# Patient Record
Sex: Male | Born: 1950
Health system: Southern US, Community
[De-identification: ages and names within clinical notes are randomized; demographics above are authoritative.]

## PROBLEM LIST (undated history)

## (undated) DIAGNOSIS — R011 Cardiac murmur, unspecified: Secondary | ICD-10-CM

## (undated) DIAGNOSIS — G709 Myoneural disorder, unspecified: Secondary | ICD-10-CM

## (undated) DIAGNOSIS — R519 Headache, unspecified: Secondary | ICD-10-CM

## (undated) DIAGNOSIS — C801 Malignant (primary) neoplasm, unspecified: Secondary | ICD-10-CM

## (undated) DIAGNOSIS — K219 Gastro-esophageal reflux disease without esophagitis: Secondary | ICD-10-CM

## (undated) DIAGNOSIS — R768 Other specified abnormal immunological findings in serum: Secondary | ICD-10-CM

## (undated) DIAGNOSIS — I10 Essential (primary) hypertension: Secondary | ICD-10-CM

## (undated) DIAGNOSIS — K759 Inflammatory liver disease, unspecified: Secondary | ICD-10-CM

## (undated) DIAGNOSIS — R51 Headache: Secondary | ICD-10-CM

## (undated) DIAGNOSIS — M1990 Unspecified osteoarthritis, unspecified site: Secondary | ICD-10-CM

## (undated) DIAGNOSIS — R7689 Other specified abnormal immunological findings in serum: Secondary | ICD-10-CM

## (undated) DIAGNOSIS — G629 Polyneuropathy, unspecified: Secondary | ICD-10-CM

## (undated) HISTORY — PX: OTHER SURGICAL HISTORY: SHX169

## (undated) HISTORY — DX: Other specified abnormal immunological findings in serum: R76.8

## (undated) HISTORY — DX: Unspecified osteoarthritis, unspecified site: M19.90

## (undated) HISTORY — DX: Essential (primary) hypertension: I10

## (undated) HISTORY — DX: Polyneuropathy, unspecified: G62.9

## (undated) HISTORY — PX: CARPAL TUNNEL RELEASE: SHX101

## (undated) HISTORY — DX: Other specified abnormal immunological findings in serum: R76.89

---

## 2005-07-14 ENCOUNTER — Ambulatory Visit: Payer: Self-pay | Admitting: Internal Medicine

## 2009-11-25 ENCOUNTER — Ambulatory Visit: Payer: Self-pay | Admitting: Internal Medicine

## 2010-01-20 ENCOUNTER — Ambulatory Visit
Admission: RE | Admit: 2010-01-20 | Discharge: 2010-01-20 | Payer: Self-pay | Source: Home / Self Care | Attending: Internal Medicine | Admitting: Internal Medicine

## 2012-05-01 ENCOUNTER — Encounter: Payer: Self-pay | Admitting: Internal Medicine

## 2012-05-01 DIAGNOSIS — D72829 Elevated white blood cell count, unspecified: Secondary | ICD-10-CM

## 2012-05-01 DIAGNOSIS — G589 Mononeuropathy, unspecified: Secondary | ICD-10-CM

## 2012-06-05 ENCOUNTER — Encounter: Payer: Self-pay | Admitting: Internal Medicine

## 2012-06-05 DIAGNOSIS — D72829 Elevated white blood cell count, unspecified: Secondary | ICD-10-CM

## 2012-06-05 DIAGNOSIS — C649 Malignant neoplasm of unspecified kidney, except renal pelvis: Secondary | ICD-10-CM

## 2012-06-05 DIAGNOSIS — B1920 Unspecified viral hepatitis C without hepatic coma: Secondary | ICD-10-CM

## 2012-07-17 ENCOUNTER — Encounter (INDEPENDENT_AMBULATORY_CARE_PROVIDER_SITE_OTHER): Payer: Self-pay | Admitting: Internal Medicine

## 2012-07-17 ENCOUNTER — Ambulatory Visit (INDEPENDENT_AMBULATORY_CARE_PROVIDER_SITE_OTHER): Payer: Medicare Other | Admitting: Internal Medicine

## 2012-07-17 VITALS — BP 132/60 | HR 70 | Temp 98.3°F | Ht 76.0 in | Wt 224.8 lb

## 2012-07-17 DIAGNOSIS — I10 Essential (primary) hypertension: Secondary | ICD-10-CM | POA: Insufficient documentation

## 2012-07-17 DIAGNOSIS — M1990 Unspecified osteoarthritis, unspecified site: Secondary | ICD-10-CM | POA: Insufficient documentation

## 2012-07-17 DIAGNOSIS — K7460 Unspecified cirrhosis of liver: Secondary | ICD-10-CM

## 2012-07-17 DIAGNOSIS — M129 Arthropathy, unspecified: Secondary | ICD-10-CM

## 2012-07-17 DIAGNOSIS — B1920 Unspecified viral hepatitis C without hepatic coma: Secondary | ICD-10-CM

## 2012-07-17 NOTE — Patient Instructions (Addendum)
OV in 1 month. Will review treatment options.

## 2012-07-17 NOTE — Progress Notes (Signed)
Subjective:     Patient ID: Jason Robinson, male   DOB: Jun 27, 1950, 62 y.o.   MRN: [id number]  HPI Here today for f/u of his Hepatitis C. Diagnosed in 1999 with Hepatitis C. Hx of failed prior antiviral therapy. He was treated about 12 yrs ago with short acting interferon and ribavirin, but he did not achieve viral clearance EOT. He was a partial responder. Treated by Dr. Karilyn Cota.  Liver biopsy over 12 yrs ago.  Risk factors for Hepatitis C: He cannot tell me. No IV drug use. No tattoos. No blood transfusions.  He tells me he underwent an [place] for Hepatitis C and was found to have a renal carcinoma. Post partial nephrectomy in 2011 by Dr. Odella Aquas. Sees Dr. Toney Reil for leukocytosis as well as lymphocytosis and well as monocytosis. He tells me he had a bone biopsy which was normal.  Genotype 1B 2012 Hep C Quantitative [phone]. 12/11/97 Liver Biopsy Hepatic tissue showing chronic active hepatitis and cirrhosis, consistent with Hepatitis C infection.   Review of Systems see hpi Past Medical History  Diagnosis Date  . Hepatitis C reactive   . Arthritis   . Neuropathy   . Hypertension    Past Surgical History  Procedure Laterality Date  . Renal carcinoa: rt kidney     Allergies  Allergen Reactions  . Penicillins          Objective:   Physical Exam  Filed Vitals:   07/17/12 1554  BP: 132/60  Pulse: 70  Temp: 98.3 F (36.8 C)  Height: 6\' 4"  (1.93 m)  Weight: 224 lb 12.8 oz (101.969 kg)  Alert and oriented. Skin warm and dry. Oral mucosa is moist.   . Sclera anicteric, conjunctivae is pink. Thyroid not enlarged. No cervical lymphadenopathy. Lungs clear. Heart regular rate and rhythm.  Abdomen is soft. Bowel sounds are positive. No hepatomegaly. No abdominal masses felt. No tenderness.  No edema to lower extremities.        Assessment:     Hepatitis C.active. He had had treatment failure in the past.  I discussed this case with Dr. Karilyn Cota.     Plan:    AFP, PT/INR,  CMET, Hep C quaint.  CBC. OV in 2 months. US abdomen (Will do US abdomen instead of a liver biopsy per Dr. Karilyn Cota) Patient will be receiving Hepatitis C treatment.

## 2012-07-18 LAB — COMPREHENSIVE METABOLIC PANEL
ALT: 67 U/L — ABNORMAL HIGH (ref 0–53)
AST: 53 U/L — ABNORMAL HIGH (ref 0–37)
Alkaline Phosphatase: 83 U/L (ref 39–117)
CO2: 21 mEq/L (ref 19–32)
Sodium: 138 mEq/L (ref 135–145)
Total Bilirubin: 0.6 mg/dL (ref 0.3–1.2)
Total Protein: 7.4 g/dL (ref 6.0–8.3)

## 2012-07-18 LAB — CBC WITH DIFFERENTIAL/PLATELET
Eosinophils Absolute: 0.4 10*3/uL (ref 0.0–0.7)
Eosinophils Relative: 3 % (ref 0–5)
HCT: 44.1 % (ref 39.0–52.0)
Hemoglobin: 15.5 g/dL (ref 13.0–17.0)
Lymphocytes Relative: 29 % (ref 12–46)
Lymphs Abs: 4.3 10*3/uL — ABNORMAL HIGH (ref 0.7–4.0)
MCH: 31.1 pg (ref 26.0–34.0)
MCV: 88.4 fL (ref 78.0–100.0)
Monocytes Absolute: 0.9 10*3/uL (ref 0.1–1.0)
Monocytes Relative: 6 % (ref 3–12)
RBC: 4.99 MIL/uL (ref 4.22–5.81)
WBC: 14.9 10*3/uL — ABNORMAL HIGH (ref 4.0–10.5)

## 2012-07-18 LAB — AFP TUMOR MARKER: AFP-Tumor Marker: 8.3 ng/mL — ABNORMAL HIGH (ref 0.0–8.0)

## 2012-07-19 LAB — HEPATITIS C RNA QUANTITATIVE
HCV Quantitative Log: 6.08 {Log} — ABNORMAL HIGH (ref <1.18)
HCV Quantitative: 1213599 IU/mL — ABNORMAL HIGH (ref <15)

## 2012-07-24 ENCOUNTER — Ambulatory Visit (HOSPITAL_COMMUNITY)
Admission: RE | Admit: 2012-07-24 | Discharge: 2012-07-24 | Disposition: A | Discharge status: Home / Self Care | Payer: Medicare Other | Source: Ambulatory Visit | Attending: Internal Medicine | Admitting: Internal Medicine

## 2012-07-24 DIAGNOSIS — B1920 Unspecified viral hepatitis C without hepatic coma: Secondary | ICD-10-CM | POA: Insufficient documentation

## 2012-07-24 DIAGNOSIS — K8020 Calculus of gallbladder without cholecystitis without obstruction: Secondary | ICD-10-CM | POA: Insufficient documentation

## 2012-08-21 ENCOUNTER — Ambulatory Visit (INDEPENDENT_AMBULATORY_CARE_PROVIDER_SITE_OTHER): Payer: Medicare Other | Admitting: Internal Medicine

## 2012-08-21 ENCOUNTER — Encounter (INDEPENDENT_AMBULATORY_CARE_PROVIDER_SITE_OTHER): Payer: Self-pay | Admitting: Internal Medicine

## 2012-08-21 VITALS — BP 128/60 | HR 60 | Temp 98.0°F | Ht 76.0 in | Wt 225.8 lb

## 2012-08-21 DIAGNOSIS — B182 Chronic viral hepatitis C: Secondary | ICD-10-CM

## 2012-08-21 DIAGNOSIS — I10 Essential (primary) hypertension: Secondary | ICD-10-CM

## 2012-08-21 DIAGNOSIS — R5381 Other malaise: Secondary | ICD-10-CM

## 2012-08-21 LAB — CBC WITH DIFFERENTIAL/PLATELET
Basophils Relative: 1 % (ref 0–1)
Eosinophils Absolute: 0.8 10*3/uL — ABNORMAL HIGH (ref 0.0–0.7)
MCH: 31.3 pg (ref 26.0–34.0)
MCHC: 34.6 g/dL (ref 30.0–36.0)
Monocytes Relative: 7 % (ref 3–12)
Neutrophils Relative %: 52 % (ref 43–77)
Platelets: 240 10*3/uL (ref 150–400)

## 2012-08-21 LAB — COMPREHENSIVE METABOLIC PANEL
ALT: 71 U/L — ABNORMAL HIGH (ref 0–53)
Albumin: 4.2 g/dL (ref 3.5–5.2)
BUN: 12 mg/dL (ref 6–23)
CO2: 23 mEq/L (ref 19–32)
Calcium: 9.6 mg/dL (ref 8.4–10.5)
Creat: 0.88 mg/dL (ref 0.50–1.35)
Total Bilirubin: 0.3 mg/dL (ref 0.3–1.2)
Total Protein: 7.2 g/dL (ref 6.0–8.3)

## 2012-08-21 NOTE — Progress Notes (Signed)
Subjective:     Patient ID: Jason Robinson, male   DOB: [birth year], 62 y.o.   MRN: [id number]  HPI Here today for f/u of his Hepatitis C. Diagnosed in 1999. He has a hx of treatment failure he says in 2000 (partial responder).  He was treated with interferon and ribavirin.  Treated by Dr. Karilyn Cota. He is Genotype 1B. There were no risk factors for Hepatitis C. No blood transfusion.  No tattoos. Only risk factor is goig fishing with a friend who had Hepatitis C and was yellow. No IV drugs. Hx of renal carcinoma. Post partial rt nephrectomy in 2012 by Dr. Odella Aquas.  12/11/97: Liver biopsy: Hepatic tissue showing chronic active hepatitis and cirrhosis, consistert with Hepatitis C infection.   Appetite is good. No weight loss. No abdominal distention. No melena or bright red rectal bleeding.  BMs are normal.        07/2012  Range 39mo ago    HCV Quantitative <15 IU/mL [id number] (H)     HCV Quantitative Log <1.18 log 10 6.08 (H)        CBC    Component Value Date/Time   WBC 14.9* 07/17/2012 1650   RBC 4.99 07/17/2012 1650   HGB 15.5 07/17/2012 1650   HCT 44.1 07/17/2012 1650   PLT 250 07/17/2012 1650   MCV 88.4 07/17/2012 1650   MCH 31.1 07/17/2012 1650   MCHC 35.1 07/17/2012 1650   RDW 14.0 07/17/2012 1650   LYMPHSABS 4.3* 07/17/2012 1650   MONOABS 0.9 07/17/2012 1650   EOSABS 0.4 07/17/2012 1650   BASOSABS 0.1 07/17/2012 1650   CMP     Component Value Date/Time   NA 138 07/17/2012 1650   K 4.5 07/17/2012 1650   CL 107 07/17/2012 1650   CO2 21 07/17/2012 1650   GLUCOSE 90 07/17/2012 1650   BUN 11 07/17/2012 1650   CREATININE 0.91 07/17/2012 1650   CALCIUM 9.6 07/17/2012 1650   PROT 7.4 07/17/2012 1650   ALBUMIN 4.4 07/17/2012 1650   AST 53* 07/17/2012 1650   ALT 67* 07/17/2012 1650   ALKPHOS 83 07/17/2012 1650   BILITOT 0.6 07/17/2012 1650    07/17/2012 : AFP 8.3 07/17/2012: PT/INR 13.1 ane 1.01   07/27/2012 US abdomen:  1. Multiple gallstones are present. No other evidence for acute   cholecystitis.  2. No hydronephrosis.  3. Limited visualization of the pancreas.    Hx of leukocytosis and has seen Dr. Asencion Noble in the past for this. He says he does not see him anymore. Leukemia was ruled out.   He is disabled. 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  He is widowed. He has 1 daughter that is adopted. and 2 granddaughters.  12/03/2008 Colonoscopy: Three small polyps ablated via cold biopsy, two from hepatic flexure.  A few small diverticula at sigmoid colon.  External and internal hemorrhoids.    Review of Systems Current Outpatient Prescriptions  Medication Sig Dispense Refill  . amLODipine-benazepril (LOTREL) 10-40 MG per capsule Take 1 capsule by mouth daily.      . Ascorbic Acid (VITAMIN C) 100 MG tablet Take 100 mg by mouth daily.      Marland Kitchen aspirin 325 MG EC tablet Take 325 mg by mouth 2 (two) times daily.      . cetirizine (ZYRTEC) 10 MG tablet Take 10 mg by mouth daily.      Marland Kitchen eletriptan (RELPAX) 40 MG tablet Take 40 mg by mouth as needed  for migraine. One tablet by mouth at onset of headache. May repeat in 2 hours if headache persists or recurs.      . Ginkgo Biloba 120 MG CAPS Take by mouth.      Marland Kitchen KRILL OIL PO Take by mouth.      . milk thistle 175 MG tablet Take 175 mg by mouth daily.      . Multiple Vitamin (MULTIVITAMIN) tablet Take 1 tablet by mouth daily.      . psyllium (METAMUCIL) 58.6 % packet Take 1 packet by mouth daily.       No current facility-administered medications for this visit.   Past Medical History  Diagnosis Date  . Hepatitis C reactive   . Arthritis   . Neuropathy     ? etiology  . Hypertension    Past Surgical History  Procedure Laterality Date  . Renal carcinoa: rt kidney     Allergies  Allergen Reactions  . Penicillins         Objective:  Filed Vitals:   08/21/12 1154  BP: 128/60  Pulse: 60  Temp: 98 F (36.7 C)  Height: 6\' 4"  (1.93 m)  Weight: 225 lb 12.8 oz (102.422 kg)      Physical  Exam Alert and oriented. Skin warm and dry. Oral mucosa is moist.   . Sclera anicteric, conjunctivae is pink. Thyroid not enlarged. No cervical lymphadenopathy. Lungs clear. Heart regular rate and rhythm.  Abdomen is soft. Bowel sounds are positive. No hepatomegaly. No abdominal masses felt. No tenderness.  No edema to lower extremities.   Bruisng noted to rt forearm.     Assessment:    Active Hepatitis C. He has been a partial responder in the past.   I discussed this case in July with Dr. Karilyn Cota.  Plan:    CBC, CMET, TSH. We will treat for Hepatitis C once these labs are back.

## 2012-08-21 NOTE — Patient Instructions (Addendum)
CBC, CMET, TSH. OV pending. We are going to treat.

## 2012-08-22 LAB — TSH: TSH: 1.593 u[IU]/mL (ref 0.350–4.500)

## 2012-08-28 ENCOUNTER — Telehealth (INDEPENDENT_AMBULATORY_CARE_PROVIDER_SITE_OTHER): Payer: Self-pay | Admitting: Internal Medicine

## 2012-08-28 NOTE — Telephone Encounter (Signed)
Wrong number.

## 2012-11-07 ENCOUNTER — Telehealth (INDEPENDENT_AMBULATORY_CARE_PROVIDER_SITE_OTHER): Payer: Self-pay | Admitting: Internal Medicine

## 2012-11-07 ENCOUNTER — Encounter (INDEPENDENT_AMBULATORY_CARE_PROVIDER_SITE_OTHER): Payer: Self-pay | Admitting: Internal Medicine

## 2012-11-07 NOTE — Telephone Encounter (Signed)
Jason Robinson, needs an OV in next 2 weeks with Tammy for patient teaching.

## 2012-11-09 ENCOUNTER — Other Ambulatory Visit: Payer: Self-pay

## 2012-11-09 NOTE — Telephone Encounter (Signed)
Apt has been scheduled for 11/15/12 with Tammy for teaching.

## 2012-11-15 ENCOUNTER — Ambulatory Visit (INDEPENDENT_AMBULATORY_CARE_PROVIDER_SITE_OTHER): Payer: Medicare Other

## 2012-11-15 ENCOUNTER — Telehealth (INDEPENDENT_AMBULATORY_CARE_PROVIDER_SITE_OTHER): Payer: Self-pay | Admitting: *Deleted

## 2012-11-15 NOTE — Telephone Encounter (Signed)
Jason Robinson presented to the office this morning for Hepatitis C Teaching. His daughter, Jason Robinson is with him as his support person. We reviewed the three (3) medications that he will be using and what they are for. He has been prescribed the following treatment plan by Dorene Ar ,NP: Pegasys 180 mcg [place] once weekly, Sovaldi 400 mg Take one 400 mg tablet PO QD with or without food and Ribavirin 200 mg Tablet  Take 3 Tablets in the morning and 3 Tablets in the evening. We reviewed the possible side effects and things that could be done to help with them.  Jason Robinson states that he will start his treatment on Monday , November 17 th. He will call and let me know and his first lab is due to be drawn 2 weeks post the beginning of his treatment (December 1 , 2014). Terri presented to the room to ask the patient if he had any other questions, He ask about if he should take his vitamin C and milk thistle ,ect. He was advised by Delrae Rend that he should. Both the patient and his daughter were given contact number to reach our office for any further concerns and or questions.

## 2012-11-15 NOTE — Telephone Encounter (Signed)
reviewed

## 2012-11-20 ENCOUNTER — Telehealth (INDEPENDENT_AMBULATORY_CARE_PROVIDER_SITE_OTHER): Payer: Self-pay | Admitting: *Deleted

## 2012-11-20 DIAGNOSIS — B1920 Unspecified viral hepatitis C without hepatic coma: Secondary | ICD-10-CM

## 2012-11-20 NOTE — Telephone Encounter (Signed)
FYI - Started his treatment today, 11/20/12.

## 2012-11-20 NOTE — Telephone Encounter (Signed)
Patient has started his Hepatitis C treatment as of today, 11/20/12. Patient will need to have CBC/Diff in 2 weeks. Noted for December 1,2014.

## 2012-11-22 ENCOUNTER — Other Ambulatory Visit (INDEPENDENT_AMBULATORY_CARE_PROVIDER_SITE_OTHER): Payer: Self-pay | Admitting: *Deleted

## 2012-11-22 ENCOUNTER — Encounter (INDEPENDENT_AMBULATORY_CARE_PROVIDER_SITE_OTHER): Payer: Self-pay | Admitting: *Deleted

## 2012-11-22 DIAGNOSIS — B1920 Unspecified viral hepatitis C without hepatic coma: Secondary | ICD-10-CM

## 2012-12-04 LAB — CBC WITH DIFFERENTIAL/PLATELET
Basophils Absolute: 0 10*3/uL (ref 0.0–0.1)
Basophils Relative: 0 % (ref 0–1)
Eosinophils Relative: 4 % (ref 0–5)
HCT: 40.5 % (ref 39.0–52.0)
Lymphocytes Relative: 43 % (ref 12–46)
Lymphs Abs: 5 10*3/uL — ABNORMAL HIGH (ref 0.7–4.0)
MCHC: 34.8 g/dL (ref 30.0–36.0)
Monocytes Absolute: 0.7 10*3/uL (ref 0.1–1.0)
Neutro Abs: 5.5 10*3/uL (ref 1.7–7.7)
Neutrophils Relative %: 47 % (ref 43–77)
Platelets: 180 10*3/uL (ref 150–400)
RBC: 4.57 MIL/uL (ref 4.22–5.81)
RDW: 14.3 % (ref 11.5–15.5)
WBC: 11.6 10*3/uL — ABNORMAL HIGH (ref 4.0–10.5)

## 2012-12-08 ENCOUNTER — Telehealth (INDEPENDENT_AMBULATORY_CARE_PROVIDER_SITE_OTHER): Payer: Self-pay | Admitting: *Deleted

## 2012-12-08 NOTE — Telephone Encounter (Signed)
CBC results noted. Mild leukocytosis otherwise normal.

## 2012-12-08 NOTE — Telephone Encounter (Signed)
Dr.Rehman the patient started Hep C treatment on 11/20/12. His 2 weeks lab (12-04-12) are in Epic for review.

## 2013-02-01 ENCOUNTER — Encounter (INDEPENDENT_AMBULATORY_CARE_PROVIDER_SITE_OTHER): Payer: Self-pay | Admitting: Internal Medicine

## 2013-02-01 ENCOUNTER — Ambulatory Visit (INDEPENDENT_AMBULATORY_CARE_PROVIDER_SITE_OTHER): Payer: Medicare Other | Admitting: Internal Medicine

## 2013-02-01 VITALS — BP 118/60 | HR 72 | Temp 98.8°F | Ht 76.0 in | Wt 228.8 lb

## 2013-02-01 DIAGNOSIS — R635 Abnormal weight gain: Secondary | ICD-10-CM

## 2013-02-01 DIAGNOSIS — K7460 Unspecified cirrhosis of liver: Secondary | ICD-10-CM

## 2013-02-01 DIAGNOSIS — R768 Other specified abnormal immunological findings in serum: Secondary | ICD-10-CM

## 2013-02-01 DIAGNOSIS — K738 Other chronic hepatitis, not elsewhere classified: Secondary | ICD-10-CM

## 2013-02-01 DIAGNOSIS — K732 Chronic active hepatitis, not elsewhere classified: Secondary | ICD-10-CM

## 2013-02-01 DIAGNOSIS — R894 Abnormal immunological findings in specimens from other organs, systems and tissues: Secondary | ICD-10-CM

## 2013-02-01 NOTE — Patient Instructions (Addendum)
OV in 4 weeks with a CBC, CMET, and Hep C quaint.

## 2013-02-01 NOTE — Progress Notes (Signed)
Subjective:     Patient ID: Jason Robinson, male   DOB: [birth year], 63 y.o.   MRN: [id number]  HPI Here today for f/u of his Hepatitis C. He is Genotype 1B. Started treatment on 11/20/2012. Presently treated with Solvaldi 400mg , Ribavirin 1280mcg, and Pegasys x  131mcg. Treatment x 12 weeks.   Diagnosed in 1999. He has a hx of treatment failure he says in 2000 (partial responder). He was treated with interferon and ribavirin. Treated by Dr. Laural Golden.  He is Genotype 1B.  This is week 10 for him Will finish February 9. There were no risk factors for Hepatitis C. No blood transfusion. No tattoos. Only risk factor is going fishing with a friend who had Hepatitis C and was yellow. No IV drugs.  Hx of renal carcinoma. Post partial rt nephrectomy in 2012 by Dr. Clent Jacks.  12/11/97: Liver biopsy: Hepatic tissue showing chronic active hepatitis and cirrhosis, consistert with Hepatitis C infection.  Appetite is good. No weight loss. No abdominal distention. No melena or bright red rectal bleeding.  BMs are normal.   07/2012  Range  73mo ago    HCV Quantitative  <15 IU/mL  [id number] (H)      HCV Quantitative Log  <1.18 log 10  6.08 (H)         He tells me he feels fine except when he takes the Pegasys. There has been no weight loss. He has actually gained 3 pounds since his last visit in August. No abdominal pain. No fever. No rashes.  Red rash to left arm and itches for a day and then goes.  Takes Pegasys on Monday nights. Urine is yellow.    CBC    Component Value Date/Time   WBC 11.6* 12/04/2012 1109   RBC 4.57 12/04/2012 1109   HGB 14.1 12/04/2012 1109   HCT 40.5 12/04/2012 1109   PLT 180 12/04/2012 1109   MCV 88.6 12/04/2012 1109   MCH 30.9 12/04/2012 1109   MCHC 34.8 12/04/2012 1109   RDW 14.3 12/04/2012 1109   LYMPHSABS 5.0* 12/04/2012 1109   MONOABS 0.7 12/04/2012 1109   EOSABS 0.4 12/04/2012 1109   BASOSABS 0.0 12/04/2012 1109  Neutrophil ABS 5.5   Review of Systems  See hpi       Objective:   Physical Exam . Filed Vitals:   02/01/13 1547  BP: 118/60  Pulse: 72  Temp: 98.8 F (37.1 C)  Height: 6\' 4"  (1.93 m)  Weight: 228 lb 12.8 oz (103.783 kg)   Alert and oriented. Skin warm and dry. Oral mucosa is moist.   . Sclera anicteric, conjunctivae is pink. Thyroid not enlarged. No cervical lymphadenopathy. Lungs clear. Heart regular rate and rhythm.  Abdomen is soft. Bowel sounds are positive. No hepatomegaly. No abdominal masses felt. No tenderness.  No edema to lower extremities. Patient is alert and oriented.     Assessment:     Hepatitis C. Will get labs today to see if he has cleared the virus.  He has been non-compliant with his office visits. Will finish tx on 02/12/2013.    Plan:     CBC, CMET, Hep C Quaint.  And TSH. OV in 4 weeks.

## 2013-02-02 LAB — CBC WITH DIFFERENTIAL/PLATELET
BASOS PCT: 0 % (ref 0–1)
Basophils Absolute: 0 10*3/uL (ref 0.0–0.1)
EOS ABS: 0.1 10*3/uL (ref 0.0–0.7)
Eosinophils Relative: 2 % (ref 0–5)
HEMATOCRIT: 37.5 % — AB (ref 39.0–52.0)
HEMOGLOBIN: 12 g/dL — AB (ref 13.0–17.0)
LYMPHS ABS: 1.9 10*3/uL (ref 0.7–4.0)
Lymphocytes Relative: 40 % (ref 12–46)
MCH: 31.7 pg (ref 26.0–34.0)
MCHC: 32 g/dL (ref 30.0–36.0)
MCV: 99.2 fL (ref 78.0–100.0)
MONOS PCT: 6 % (ref 3–12)
Monocytes Absolute: 0.3 10*3/uL (ref 0.1–1.0)
NEUTROS PCT: 52 % (ref 43–77)
Neutro Abs: 2.6 10*3/uL (ref 1.7–7.7)
Platelets: 118 10*3/uL — ABNORMAL LOW (ref 150–400)
RBC: 3.78 MIL/uL — AB (ref 4.22–5.81)
RDW: 14.5 % (ref 11.5–15.5)
WBC: 4.9 10*3/uL (ref 4.0–10.5)

## 2013-02-02 LAB — COMPREHENSIVE METABOLIC PANEL
ALT: 15 U/L (ref 0–53)
AST: 24 U/L (ref 0–37)
Albumin: 3.7 g/dL (ref 3.5–5.2)
Alkaline Phosphatase: 92 U/L (ref 39–117)
BILIRUBIN TOTAL: 1 mg/dL (ref 0.2–1.2)
BUN: 8 mg/dL (ref 6–23)
CO2: 21 meq/L (ref 19–32)
Calcium: 8.4 mg/dL (ref 8.4–10.5)
Chloride: 109 mEq/L (ref 96–112)
Creat: 0.85 mg/dL (ref 0.50–1.35)
GLUCOSE: 86 mg/dL (ref 70–99)
Potassium: 4.4 mEq/L (ref 3.5–5.3)
SODIUM: 139 meq/L (ref 135–145)
Total Protein: 6.7 g/dL (ref 6.0–8.3)

## 2013-02-02 LAB — TSH: TSH: 0.945 u[IU]/mL (ref 0.350–4.500)

## 2013-02-05 LAB — HEPATITIS C RNA QUANTITATIVE: HCV QUANT: NOT DETECTED [IU]/mL (ref <15)

## 2013-02-26 ENCOUNTER — Ambulatory Visit (INDEPENDENT_AMBULATORY_CARE_PROVIDER_SITE_OTHER): Payer: Medicare Other | Admitting: Internal Medicine

## 2013-03-12 ENCOUNTER — Encounter (INDEPENDENT_AMBULATORY_CARE_PROVIDER_SITE_OTHER): Payer: Self-pay | Admitting: Internal Medicine

## 2013-03-12 ENCOUNTER — Ambulatory Visit (INDEPENDENT_AMBULATORY_CARE_PROVIDER_SITE_OTHER): Payer: Medicare Other | Admitting: Internal Medicine

## 2013-03-12 VITALS — BP 118/72 | HR 84 | Temp 97.7°F | Ht 76.0 in | Wt 228.3 lb

## 2013-03-12 DIAGNOSIS — B1920 Unspecified viral hepatitis C without hepatic coma: Secondary | ICD-10-CM

## 2013-03-12 NOTE — Patient Instructions (Signed)
OV in 6 months. CBC, CMET, Hep C quaint today.

## 2013-03-12 NOTE — Progress Notes (Signed)
Subjective:     Patient ID: Jason Robinson, male   DOB: 10/13/1950, 63 y.o.   MRN: [id number]  HPI  Here today for f/u of his Hepatitis C. Started 11/20/2012. Treated with solvaldi 400mg , Ribavirin 1248mcg, and Pegasys 115mcg. Treated x 12 weeks. Finished treatment one month ago.(finished February 9).   02/01/2013 HCV Quaint undetected.  Genotype 1 B.  Hx of renal carcinoma. Post partial rt nephrectomy in 2012 by Dr. Clent Jacks.  12/11/97: Liver biopsy: Hepatic tissue showing chronic active hepatitis and cirrhosis, consistert with Hepatitis C infection.  Appetite is good. No weight loss. No abdominal distention. No melena or bright red rectal bleeding.  BMs are normal.   His appetite is good. No weight loss. He has maintained his weight. There is no rashes.  He did have a rash to both elbow area (anterior). Describes as a fine rash. Appear after the injection and would disappear before the next injection of Inteferon. BMs are moving okay.  CBC    Component Value Date/Time   WBC 4.9 02/01/2013 1619   RBC 3.78* 02/01/2013 1619   HGB 12.0* 02/01/2013 1619   HCT 37.5* 02/01/2013 1619   PLT 118* 02/01/2013 1619   MCV 99.2 02/01/2013 1619   MCH 31.7 02/01/2013 1619   MCHC 32.0 02/01/2013 1619   RDW 14.5 02/01/2013 1619   LYMPHSABS 1.9 02/01/2013 1619   MONOABS 0.3 02/01/2013 1619   EOSABS 0.1 02/01/2013 1619   BASOSABS 0.0 02/01/2013 1619   CMP     Component Value Date/Time   NA 139 02/01/2013 1619   K 4.4 02/01/2013 1619   CL 109 02/01/2013 1619   CO2 21 02/01/2013 1619   GLUCOSE 86 02/01/2013 1619   BUN 8 02/01/2013 1619   CREATININE 0.85 02/01/2013 1619   CALCIUM 8.4 02/01/2013 1619   PROT 6.7 02/01/2013 1619   ALBUMIN 3.7 02/01/2013 1619   AST 24 02/01/2013 1619   ALT 15 02/01/2013 1619   ALKPHOS 92 02/01/2013 1619   BILITOT 1.0 02/01/2013 1619             Review of Systems Past Medical History  Diagnosis Date  . Hepatitis C reactive   . Arthritis   . Neuropathy     ? etiology  .  Hypertension     Past Surgical History  Procedure Laterality Date  . Renal carcinoa: rt kidney      Allergies  Allergen Reactions  . Penicillins     Current Outpatient Prescriptions on File Prior to Visit  Medication Sig Dispense Refill  . amLODipine-benazepril (LOTREL) 10-40 MG per capsule Take 1 capsule by mouth daily.      . Ascorbic Acid (VITAMIN C) 100 MG tablet Take 100 mg by mouth daily.      Marland Kitchen aspirin 325 MG EC tablet Take 325 mg by mouth 2 (two) times daily.      . cetirizine (ZYRTEC) 10 MG tablet Take 10 mg by mouth daily.      Marland Kitchen eletriptan (RELPAX) 40 MG tablet Take 40 mg by mouth as needed for migraine. One tablet by mouth at onset of headache. May repeat in 2 hours if headache persists or recurs.      . Ginkgo Biloba 120 MG CAPS Take by mouth.      Marland Kitchen KRILL OIL PO Take by mouth.      . milk thistle 175 MG tablet Take 175 mg by mouth daily.      . Multiple Vitamin (MULTIVITAMIN) tablet Take  1 tablet by mouth daily.      . psyllium (METAMUCIL) 58.6 % packet Take 1 packet by mouth daily.       No current facility-administered medications on file prior to visit.        Objective:   Physical Exam There were no vitals filed for this visit. Filed Vitals:   03/12/13 1539  BP: 118/72  Pulse: 84  Temp: 97.7 F (36.5 C)  Height: 6\' 4"  (1.93 m)  Weight: 228 lb 4.8 oz (103.556 kg)  Alert and oriented. Skin warm and dry. Oral mucosa is moist.   . Sclera anicteric, conjunctivae is pink. Thyroid not enlarged. No cervical lymphadenopathy. Lungs clear. Heart regular rate and rhythm.  Abdomen is soft. Bowel sounds are positive. No hepatomegaly. No abdominal masses felt. No tenderness.  No edema to lower extremities.        Assessment:     Hepatitis C. He has cleared the virus. He feels good. No rash. Appetite has remained good.     Plan:    CBC, CMET, Hep C Quaint. OV in 6 months. Will get a US abdomen and a quaint at his next office visit.

## 2013-03-13 LAB — CBC WITH DIFFERENTIAL/PLATELET
BASOS ABS: 0 10*3/uL (ref 0.0–0.1)
Basophils Relative: 0 % (ref 0–1)
Eosinophils Absolute: 0.2 10*3/uL (ref 0.0–0.7)
Eosinophils Relative: 2 % (ref 0–5)
HCT: 43.8 % (ref 39.0–52.0)
Hemoglobin: 14.6 g/dL (ref 13.0–17.0)
LYMPHS ABS: 2 10*3/uL (ref 0.7–4.0)
Lymphocytes Relative: 27 % (ref 12–46)
MCH: 31.5 pg (ref 26.0–34.0)
MCHC: 33.3 g/dL (ref 30.0–36.0)
MCV: 94.4 fL (ref 78.0–100.0)
Monocytes Absolute: 0.5 10*3/uL (ref 0.1–1.0)
Monocytes Relative: 7 % (ref 3–12)
NEUTROS ABS: 4.8 10*3/uL (ref 1.7–7.7)
Neutrophils Relative %: 64 % (ref 43–77)
Platelets: 115 10*3/uL — ABNORMAL LOW (ref 150–400)
RBC: 4.64 MIL/uL (ref 4.22–5.81)
RDW: 13.3 % (ref 11.5–15.5)
WBC: 7.5 10*3/uL (ref 4.0–10.5)

## 2013-03-13 LAB — COMPREHENSIVE METABOLIC PANEL
ALT: 18 U/L (ref 0–53)
AST: 25 U/L (ref 0–37)
Albumin: 4.1 g/dL (ref 3.5–5.2)
Alkaline Phosphatase: 92 U/L (ref 39–117)
BUN: 12 mg/dL (ref 6–23)
CALCIUM: 9 mg/dL (ref 8.4–10.5)
CO2: 19 mEq/L (ref 19–32)
Chloride: 107 mEq/L (ref 96–112)
Creat: 0.89 mg/dL (ref 0.50–1.35)
Glucose, Bld: 90 mg/dL (ref 70–99)
POTASSIUM: 4.2 meq/L (ref 3.5–5.3)
SODIUM: 139 meq/L (ref 135–145)
TOTAL PROTEIN: 6.9 g/dL (ref 6.0–8.3)
Total Bilirubin: 0.4 mg/dL (ref 0.2–1.2)

## 2013-03-14 LAB — HEPATITIS C RNA QUANTITATIVE: HCV Quantitative: NOT DETECTED IU/mL (ref <15)

## 2013-09-17 ENCOUNTER — Ambulatory Visit (INDEPENDENT_AMBULATORY_CARE_PROVIDER_SITE_OTHER): Payer: Medicare Other | Admitting: Internal Medicine

## 2013-09-17 ENCOUNTER — Encounter (INDEPENDENT_AMBULATORY_CARE_PROVIDER_SITE_OTHER): Payer: Self-pay | Admitting: *Deleted

## 2013-09-17 ENCOUNTER — Encounter (INDEPENDENT_AMBULATORY_CARE_PROVIDER_SITE_OTHER): Payer: Self-pay | Admitting: Internal Medicine

## 2013-09-17 VITALS — BP 130/70 | HR 74 | Temp 97.2°F | Resp 18 | Ht 76.0 in | Wt 226.1 lb

## 2013-09-17 DIAGNOSIS — K7460 Unspecified cirrhosis of liver: Secondary | ICD-10-CM

## 2013-09-17 DIAGNOSIS — B182 Chronic viral hepatitis C: Secondary | ICD-10-CM

## 2013-09-17 DIAGNOSIS — Z85528 Personal history of other malignant neoplasm of kidney: Secondary | ICD-10-CM | POA: Insufficient documentation

## 2013-09-17 NOTE — Patient Instructions (Signed)
Physician will call with results of blood tests and ultrasound. We will request colonoscopy records from [facility] [facility] before next exam scheduled

## 2013-09-17 NOTE — Progress Notes (Signed)
Presenting complaint;  Followup for cirrhosis secondary to hepatitis C status post treatment.  Database;  Patient has biopsy proven cirrhosis secondary to hepatitis C genotype 1B who failed therapy about 10 years ago. He was treated with 12 weeks of Sovaldi, Pegasys and co-Pegus. He finished therapy on 02/12/2013 and HCVRNA at end of treatment was negative. He did not experience much in the they have side effects of therapy. He now returns for followup visit.  Subjective:  Patient has no complaints. He states his appetite is very good. Since he's been eating better his bowels are moving regularly and he is not constipated anymore. He also feels he has more energy than he had before. He denies abdominal pain nausea vomiting melena or rectal bleeding. He also wants to know timing of his next colonoscopy. He has history of colonic adenomas. He is still smoking cigarettes.   Current Medications: Outpatient Encounter Prescriptions as of 09/17/2013  Medication Sig  . amLODipine-benazepril (LOTREL) 10-40 MG per capsule Take 1 capsule by mouth daily.  . Ascorbic Acid (VITAMIN C) 100 MG tablet Take 500 mg by mouth daily.   Marland Kitchen aspirin 325 MG EC tablet Take 325 mg by mouth daily.   . cetirizine (ZYRTEC) 10 MG tablet Take 10 mg by mouth daily.  Marland Kitchen eletriptan (RELPAX) 40 MG tablet Take 40 mg by mouth as needed for migraine. One tablet by mouth at onset of headache. May repeat in 2 hours if headache persists or recurs.  . fluticasone (FLONASE) 50 MCG/ACT nasal spray Place 2 sprays into both nostrils daily.   . Ginkgo Biloba 120 MG CAPS Take by mouth.  Marland Kitchen KRILL OIL PO Take by mouth.  . milk thistle 175 MG tablet Take 175 mg by mouth daily.  . Multiple Vitamin (MULTIVITAMIN) tablet Take 1 tablet by mouth daily.  . [DISCONTINUED] psyllium (METAMUCIL) 58.6 % packet Take 1 packet by mouth daily.     Objective: Blood pressure 130/70, pulse 74, temperature 97.2 F (36.2 C), temperature source Oral, resp.  rate 18, height 6\' 4"  (1.93 m), weight 226 lb 1.6 oz (102.558 kg). Patient is alert and in no acute distress. Conjunctiva is pink. Sclera is nonicteric Oropharyngeal mucosa is normal. No neck masses or thyromegaly noted. Cardiac exam with regular rhythm normal S1 and S2. No murmur or gallop noted. Lungs are clear to auscultation. Abdomen is full but soft and nontender without organomegaly or masses. Shifting dullness is absent.  No LE edema or clubbing noted.  Labs/studies Results: Lab data from 03/12/2013   WBC 7.5, H&H 14.6 and 43.8 and platelet count 115 K. Electrolytes within normal limits. Glucose 90 BUN 12 and creatinine 0.89 Bilirubin 0.4, AP 92, AST 25, ALT 18, albumin 4.1 and calcium 9 point HCVRNA by PCR was undetectable..    Assessment:  #1. Cirrhosis secondary to chronic hepatitis C genotype 1b status post 12 weeks of therapy with Sovaldi, Pegasys and copegus.Marland Kitchen HCVRNA was negative at EOT. This test needs to be repeated to assess for SVR. He has mild thrombocytopenia secondary to cirrhosis and splenomegaly. He also needs to undergo screening for [facility].  #2. History of colonic adenomas. Last colonoscopy was in November 2010 when he had three tubular adenomas.  Plan:  Patient will go the lab for CBC, comprehensive chemistry panel, HCVRNA by PCR quantitative and AFP. Upper abdominal ultrasound. Surveillance colonoscopy later this fall. Office visit in one year.

## 2013-09-18 LAB — COMPREHENSIVE METABOLIC PANEL
ALT: 15 U/L (ref 0–53)
AST: 22 U/L (ref 0–37)
Albumin: 4.4 g/dL (ref 3.5–5.2)
Alkaline Phosphatase: 93 U/L (ref 39–117)
BILIRUBIN TOTAL: 0.5 mg/dL (ref 0.2–1.2)
BUN: 10 mg/dL (ref 6–23)
CO2: 27 meq/L (ref 19–32)
CREATININE: 1.04 mg/dL (ref 0.50–1.35)
Calcium: 9.9 mg/dL (ref 8.4–10.5)
Chloride: 105 mEq/L (ref 96–112)
Glucose, Bld: 58 mg/dL — ABNORMAL LOW (ref 70–99)
Potassium: 4.4 mEq/L (ref 3.5–5.3)
Sodium: 141 mEq/L (ref 135–145)
Total Protein: 7.4 g/dL (ref 6.0–8.3)

## 2013-09-18 LAB — CBC
HCT: 45.7 % (ref 39.0–52.0)
Hemoglobin: 15.8 g/dL (ref 13.0–17.0)
MCH: 31.2 pg (ref 26.0–34.0)
MCHC: 34.6 g/dL (ref 30.0–36.0)
MCV: 90.3 fL (ref 78.0–100.0)
PLATELETS: 248 10*3/uL (ref 150–400)
RBC: 5.06 MIL/uL (ref 4.22–5.81)
RDW: 13.4 % (ref 11.5–15.5)
WBC: 15.8 10*3/uL — ABNORMAL HIGH (ref 4.0–10.5)

## 2013-09-18 LAB — AFP TUMOR MARKER: AFP-Tumor Marker: 3.2 ng/mL (ref <6.1)

## 2013-09-25 ENCOUNTER — Telehealth (INDEPENDENT_AMBULATORY_CARE_PROVIDER_SITE_OTHER): Payer: Self-pay | Admitting: *Deleted

## 2013-09-25 DIAGNOSIS — B182 Chronic viral hepatitis C: Secondary | ICD-10-CM

## 2013-09-25 NOTE — Telephone Encounter (Signed)
Per Dr.Rehman the patient will need to have labs drawn. 

## 2013-10-08 ENCOUNTER — Ambulatory Visit (HOSPITAL_COMMUNITY)
Admission: RE | Admit: 2013-10-08 | Discharge: 2013-10-08 | Disposition: A | Discharge status: Home / Self Care | Payer: Medicare Other | Source: Ambulatory Visit | Attending: Diagnostic Radiology | Admitting: Diagnostic Radiology

## 2013-10-08 DIAGNOSIS — K8020 Calculus of gallbladder without cholecystitis without obstruction: Secondary | ICD-10-CM | POA: Diagnosis not present

## 2013-10-08 DIAGNOSIS — B182 Chronic viral hepatitis C: Secondary | ICD-10-CM | POA: Insufficient documentation

## 2013-10-08 DIAGNOSIS — K7460 Unspecified cirrhosis of liver: Secondary | ICD-10-CM

## 2013-10-08 DIAGNOSIS — Z905 Acquired absence of kidney: Secondary | ICD-10-CM | POA: Insufficient documentation

## 2013-12-10 ENCOUNTER — Encounter (INDEPENDENT_AMBULATORY_CARE_PROVIDER_SITE_OTHER): Payer: Self-pay | Admitting: *Deleted

## 2013-12-14 ENCOUNTER — Encounter (INDEPENDENT_AMBULATORY_CARE_PROVIDER_SITE_OTHER): Payer: Self-pay | Admitting: Internal Medicine

## 2014-01-01 ENCOUNTER — Encounter (INDEPENDENT_AMBULATORY_CARE_PROVIDER_SITE_OTHER): Payer: Self-pay | Admitting: Internal Medicine

## 2014-01-10 ENCOUNTER — Other Ambulatory Visit (INDEPENDENT_AMBULATORY_CARE_PROVIDER_SITE_OTHER): Payer: Self-pay | Admitting: *Deleted

## 2014-01-10 ENCOUNTER — Telehealth (INDEPENDENT_AMBULATORY_CARE_PROVIDER_SITE_OTHER): Payer: Self-pay | Admitting: *Deleted

## 2014-01-10 DIAGNOSIS — Z86010 Personal history of colonic polyps: Secondary | ICD-10-CM

## 2014-01-10 DIAGNOSIS — Z1211 Encounter for screening for malignant neoplasm of colon: Secondary | ICD-10-CM

## 2014-01-10 NOTE — Telephone Encounter (Signed)
Patient left a message on my voicemail. He states that he is ready to arrange his Colonoscopy. States that he was to have already had one but due to his sister's death didn't. He may be reached at [phone].

## 2014-01-10 NOTE — Telephone Encounter (Signed)
TCS sch'd 01/25/14, patient aware

## 2014-01-10 NOTE — Telephone Encounter (Signed)
Referring MD/PCP: vyas   Procedure: tcs  Reason/Indication:  Hx polyps  Has patient had this procedure before?  Yes, 2010 -- paper chart  If so, when, by whom and where?    Is there a family history of colon cancer?  no  Who?  What age when diagnosed?    Is patient diabetic?   no      Does patient have prosthetic heart valve?  no  Do you have a pacemaker?  no  Has patient ever had endocarditis? no  Has patient had joint replacement within last 12 months?  no  Does patient tend to be constipated or take laxatives? o  Is patient on Coumadin, Plavix and/or Aspirin? yes  Medications: see epic  Allergies: see epic  Medication Adjustment: asa 2 days  Procedure date & time: 01/25/14 at 1120

## 2014-01-10 NOTE — Telephone Encounter (Signed)
Patient needs trilyte 

## 2014-01-11 MED ORDER — PEG 3350-KCL-NA BICARB-NACL 420 G PO SOLR: 4000.0000 mL | Freq: Once | ORAL | Status: DC

## 2014-01-11 NOTE — Telephone Encounter (Signed)
agree

## 2014-01-25 ENCOUNTER — Encounter (HOSPITAL_COMMUNITY)
Admission: RE | Disposition: A | Discharge status: Home / Self Care | Payer: Self-pay | Source: Ambulatory Visit | Attending: Internal Medicine

## 2014-01-25 ENCOUNTER — Ambulatory Visit (HOSPITAL_COMMUNITY)
Admission: RE | Admit: 2014-01-25 | Discharge: 2014-01-25 | Disposition: A | Discharge status: Home / Self Care | Payer: Medicare Other | Source: Ambulatory Visit | Attending: Internal Medicine | Admitting: Internal Medicine

## 2014-01-25 ENCOUNTER — Encounter (HOSPITAL_COMMUNITY): Payer: Self-pay | Admitting: *Deleted

## 2014-01-25 DIAGNOSIS — Z86010 Personal history of colonic polyps: Secondary | ICD-10-CM | POA: Insufficient documentation

## 2014-01-25 DIAGNOSIS — Z8553 Personal history of malignant neoplasm of renal pelvis: Secondary | ICD-10-CM | POA: Diagnosis not present

## 2014-01-25 DIAGNOSIS — Z7982 Long term (current) use of aspirin: Secondary | ICD-10-CM | POA: Diagnosis not present

## 2014-01-25 DIAGNOSIS — Z880 Allergy status to penicillin: Secondary | ICD-10-CM | POA: Insufficient documentation

## 2014-01-25 DIAGNOSIS — B1920 Unspecified viral hepatitis C without hepatic coma: Secondary | ICD-10-CM | POA: Diagnosis not present

## 2014-01-25 DIAGNOSIS — K5730 Diverticulosis of large intestine without perforation or abscess without bleeding: Secondary | ICD-10-CM | POA: Diagnosis not present

## 2014-01-25 DIAGNOSIS — Z7951 Long term (current) use of inhaled steroids: Secondary | ICD-10-CM | POA: Insufficient documentation

## 2014-01-25 DIAGNOSIS — Z1211 Encounter for screening for malignant neoplasm of colon: Secondary | ICD-10-CM | POA: Insufficient documentation

## 2014-01-25 DIAGNOSIS — G43909 Migraine, unspecified, not intractable, without status migrainosus: Secondary | ICD-10-CM | POA: Insufficient documentation

## 2014-01-25 DIAGNOSIS — F17210 Nicotine dependence, cigarettes, uncomplicated: Secondary | ICD-10-CM | POA: Insufficient documentation

## 2014-01-25 DIAGNOSIS — D123 Benign neoplasm of transverse colon: Secondary | ICD-10-CM

## 2014-01-25 DIAGNOSIS — I10 Essential (primary) hypertension: Secondary | ICD-10-CM | POA: Insufficient documentation

## 2014-01-25 DIAGNOSIS — G629 Polyneuropathy, unspecified: Secondary | ICD-10-CM | POA: Diagnosis not present

## 2014-01-25 DIAGNOSIS — K648 Other hemorrhoids: Secondary | ICD-10-CM | POA: Insufficient documentation

## 2014-01-25 DIAGNOSIS — Z888 Allergy status to other drugs, medicaments and biological substances status: Secondary | ICD-10-CM | POA: Diagnosis not present

## 2014-01-25 DIAGNOSIS — K649 Unspecified hemorrhoids: Secondary | ICD-10-CM

## 2014-01-25 HISTORY — PX: COLONOSCOPY: SHX5424

## 2014-01-25 SURGERY — COLONOSCOPY
Anesthesia: Moderate Sedation

## 2014-01-25 MED ORDER — MEPERIDINE HCL 50 MG/ML IJ SOLN
INTRAMUSCULAR | Status: DC | PRN
  Administered 2014-01-25 (×2): 25 mg via INTRAVENOUS

## 2014-01-25 MED ORDER — MIDAZOLAM HCL 5 MG/5ML IJ SOLN
INTRAMUSCULAR | Status: DC | PRN
  Administered 2014-01-25: 2 mg via INTRAVENOUS
  Administered 2014-01-25: 3 mg via INTRAVENOUS
  Administered 2014-01-25: 2 mg via INTRAVENOUS
  Administered 2014-01-25: 3 mg via INTRAVENOUS

## 2014-01-25 MED ORDER — STERILE WATER FOR IRRIGATION IR SOLN
Status: DC | PRN
  Administered 2014-01-25: 12:00:00

## 2014-01-25 MED ORDER — SODIUM CHLORIDE 0.9 % IV SOLN
INTRAVENOUS | Status: DC
  Administered 2014-01-25: 1000 mL via INTRAVENOUS

## 2014-01-25 MED ORDER — MEPERIDINE HCL 50 MG/ML IJ SOLN
INTRAMUSCULAR | Status: AC
  Filled 2014-01-25: qty 1

## 2014-01-25 MED ORDER — MIDAZOLAM HCL 5 MG/5ML IJ SOLN
INTRAMUSCULAR | Status: AC
  Filled 2014-01-25: qty 10

## 2014-01-25 NOTE — H&P (Signed)
Jason Robinson is an 64 y.o. male.   Chief Complaint: Patient is here for colonoscopy. HPI: Patient is 64 year old Caucasian male who was history of colonic adenomas and is here for surveillance colonoscopy he denies abdominal pain change in bowel habits or rectal bleeding. Personal history significant for renal CAD remains in remission. Family history is negative for CRC.    Past Medical History  Diagnosis Date  . Hepatitis C; successfully treated with SVR 2015   . Arthritis   . Neuropathy     ? etiology  . Hypertension     Past Surgical History  Procedure Laterality Date  . Renal carcinoa: rt kidney      History reviewed. No pertinent family history. Social History:  reports that he has been smoking.  He has never used smokeless tobacco. He reports that he does not drink alcohol or use illicit drugs.  Allergies:  Allergies  Allergen Reactions  . Lyrica [Pregabalin] Swelling    Swelling to feet  . Penicillins Other (See Comments)    Patient has never taken this medicine.  Had an allergy test done and was told he was allergic to penicillin.  Marland Kitchen Kwell [Lindane] Rash    Medications Prior to Admission  Medication Sig Dispense Refill  . amLODipine-benazepril (LOTREL) 10-40 MG per capsule Take 1 capsule by mouth daily.    . Ascorbic Acid (VITAMIN C) 100 MG tablet Take 500 mg by mouth daily.     Marland Kitchen aspirin EC 81 MG tablet Take 81 mg by mouth daily.    . cetirizine (ZYRTEC) 10 MG tablet Take 10 mg by mouth daily as needed for allergies.     . fluticasone (FLONASE) 50 MCG/ACT nasal spray Place 2 sprays into both nostrils daily.     . Ginkgo Biloba 120 MG CAPS Take 1 capsule by mouth daily.     Marland Kitchen KRILL OIL PO Take 1 capsule by mouth daily.     . milk thistle 175 MG tablet Take 175 mg by mouth daily.    . Multiple Vitamin (MULTIVITAMIN) tablet Take 1 tablet by mouth daily.    . polyethylene glycol-electrolytes (NULYTELY/GOLYTELY) 420 G solution Take 4,000 mLs by mouth once. 4000 mL  0  . eletriptan (RELPAX) 40 MG tablet Take 40 mg by mouth as needed for migraine. One tablet by mouth at onset of headache. May repeat in 2 hours if headache persists or recurs.      No results found for this or any previous visit (from the past 48 hour(s)). No results found.  ROS  Blood pressure 127/72, pulse 65, temperature 98.8 F (37.1 C), temperature source Oral, resp. rate 8, height 6\' 4"  (1.93 m), weight 230 lb (104.327 kg), SpO2 100 %. Physical Exam  Constitutional: He appears well-developed and well-nourished.  HENT:  Mouth/Throat: Oropharynx is clear and moist.  Small nodule involving palate posteriorly on the left site  Eyes: Conjunctivae are normal. No scleral icterus.  Neck: No thyromegaly present.  Cardiovascular: Normal rate, regular rhythm and normal heart sounds.   No murmur heard. Respiratory: Effort normal and breath sounds normal.  GI: Soft. He exhibits no distension and no mass. There is no tenderness.  Musculoskeletal: He exhibits no edema.  Lymphadenopathy:    He has no cervical adenopathy.  Neurological: He is alert.  Skin: Skin is warm and dry.     Assessment/Plan History of colonic adenomas. Surveillance colonoscopy.  Jason Robinson U 01/25/2014, 11:32 AM

## 2014-01-25 NOTE — Op Note (Signed)
COLONOSCOPY PROCEDURE REPORT  PATIENT:  Jason Robinson  MR#:  [id number] Birthdate:  [birth year], 64 y.o., male Endoscopist:  Dr. Rogene Houston, MD Referred By:  Dr. Rennis Petty b. Woody Seller, MD Procedure Date: 01/25/2014  Procedure:   Colonoscopy  Indications:  Patient is 64 year old Caucasian male with history of colonic adenomas who is here for surveillance colonoscopy. His last exam was over 5 years ago.  Informed Consent:  The procedure and risks were reviewed with the patient and informed consent was obtained.  Medications:  Demerol 50 mg IV Versed 10 mg IV  Description of procedure:  After a digital rectal exam was performed, that colonoscope was advanced from the anus through the rectum and colon to the area of the cecum, ileocecal valve and appendiceal orifice. The cecum was deeply intubated. These structures were well-seen and photographed for the record. From the level of the cecum and ileocecal valve, the scope was slowly and cautiously withdrawn. The mucosal surfaces were carefully surveyed utilizing scope tip to flexion to facilitate fold flattening as needed. The scope was pulled down into the rectum where a thorough exam including retroflexion was performed.  Findings:   Prep fair at cecum and ascending colon and satisfactory distal to hepatic flexure. Two small polyps were cold snared from hepatic flexure. 6 mm polyp cold snare from proximal transverse colon. Scattered diverticula at sigmoid colon. Normal rectal mucosa. Prominent hemorrhoids above the dentate line.   Therapeutic/Diagnostic Maneuvers Performed:  See above  Complications:  None  Cecal Withdrawal Time:  13 minutes  Impression:  Examination performed to cecum. Three small polyps were cold snared and submitted together (two of these were located at hepatic flexure and the third one at proximal transverse colon. Polyp at transverse colon was 6 mm and other two were smaller). Mild sigmoid colon  diverticulosis. Internal hemorrhoids.   Recommendations:  Standard instructions given. High fiber diet. Patient has small nodule involving improve of mouth posteriorly on the left. Patient will talk with Dr. Woody Seller about referral to ENT specialist unless Dr. Woody Seller can excise it. I will contact patient with biopsy results and further recommendations.  REHMAN,NAJEEB U  01/25/2014 12:23 PM  CC: Dr. Glenda Chroman., MD & Dr. Rayne Du ref. provider found

## 2014-01-25 NOTE — Discharge Instructions (Signed)
Resume usual medications and high fiber diet. No driving for 24 hours. Physician will call with biopsy results.  Colonoscopy, Care After Refer to this sheet in the next few weeks. These instructions provide you with information on caring for yourself after your procedure. Your health care provider may also give you more specific instructions. Your treatment has been planned according to current medical practices, but problems sometimes occur. Call your health care provider if you have any problems or questions after your procedure. WHAT TO EXPECT AFTER THE PROCEDURE  After your procedure, it is typical to have the following:  A small amount of blood in your stool.  Moderate amounts of gas and mild abdominal cramping or bloating. HOME CARE INSTRUCTIONS  Do not drive, operate machinery, or sign important documents for 24 hours.  You may shower and resume your regular physical activities, but move at a slower pace for the first 24 hours.  Take frequent rest periods for the first 24 hours.  Walk around or put a warm pack on your abdomen to help reduce abdominal cramping and bloating.  Drink enough fluids to keep your urine clear or pale yellow.  You may resume your normal diet as instructed by your health care provider. Avoid heavy or fried foods that are hard to digest.  Avoid drinking alcohol for 24 hours or as instructed by your health care provider.  Only take over-the-counter or prescription medicines as directed by your health care provider.  If a tissue sample (biopsy) was taken during your procedure:  Do not take aspirin or blood thinners for 7 days, or as instructed by your health care provider.  Do not drink alcohol for 7 days, or as instructed by your health care provider.  Eat soft foods for the first 24 hours. SEEK MEDICAL CARE IF: You have persistent spotting of blood in your stool 2-3 days after the procedure. SEEK IMMEDIATE MEDICAL CARE IF:  You have more than a  small spotting of blood in your stool.  You pass large blood clots in your stool.  Your abdomen is swollen (distended).  You have nausea or vomiting.  You have a fever.  You have increasing abdominal pain that is not relieved with medicine. Document Released: 08/05/2003 Document Revised: 10/11/2012 Document Reviewed: 08/28/2012 [employer] Patient Information 2015 [place], [place]. This information is not intended to replace advice given to you by your health care provider. Make sure you discuss any questions you have with your health care provider.

## 2014-01-28 ENCOUNTER — Encounter (HOSPITAL_COMMUNITY): Payer: Self-pay | Admitting: Internal Medicine

## 2014-02-04 ENCOUNTER — Encounter (INDEPENDENT_AMBULATORY_CARE_PROVIDER_SITE_OTHER): Payer: Self-pay | Admitting: *Deleted

## 2014-06-20 ENCOUNTER — Encounter (INDEPENDENT_AMBULATORY_CARE_PROVIDER_SITE_OTHER): Payer: Self-pay | Admitting: *Deleted

## 2014-09-19 ENCOUNTER — Encounter (INDEPENDENT_AMBULATORY_CARE_PROVIDER_SITE_OTHER): Payer: Self-pay | Admitting: Internal Medicine

## 2014-09-19 ENCOUNTER — Encounter (INDEPENDENT_AMBULATORY_CARE_PROVIDER_SITE_OTHER): Payer: Self-pay | Admitting: *Deleted

## 2014-09-19 ENCOUNTER — Ambulatory Visit (INDEPENDENT_AMBULATORY_CARE_PROVIDER_SITE_OTHER): Payer: Medicare Other | Admitting: Internal Medicine

## 2014-09-19 VITALS — BP 126/60 | HR 72 | Temp 98.7°F | Ht 76.0 in | Wt 223.1 lb

## 2014-09-19 DIAGNOSIS — B1920 Unspecified viral hepatitis C without hepatic coma: Secondary | ICD-10-CM

## 2014-09-19 LAB — HEPATIC FUNCTION PANEL
ALT: 13 U/L (ref 9–46)
AST: 18 U/L (ref 10–35)
Albumin: 4.4 g/dL (ref 3.6–5.1)
Alkaline Phosphatase: 104 U/L (ref 40–115)
BILIRUBIN DIRECT: 0.1 mg/dL (ref <=0.2)
BILIRUBIN INDIRECT: 0.5 mg/dL (ref 0.2–1.2)
Total Bilirubin: 0.6 mg/dL (ref 0.2–1.2)
Total Protein: 7.6 g/dL (ref 6.1–8.1)

## 2014-09-19 NOTE — Progress Notes (Signed)
Subjective:    Patient ID: Jason Robinson, male    DOB: [birth year], 64 y.o.   MRN: [id number]  HPI Here today for f/u of his Hepatitis C. He is genotype 1B. He failed tx 10 yrs ago with Dr. Laural Golden.  He has biopsy proven cirrhosis secondary to Hepatitis C. He was treated with 12 weeks of Solvaldi, Pegasys and Co-Pegus. He finished therapy 02/12/2013. HCVRNA at end of tx was negative.  He did not experience side effects with therapy. He tells me he is doing good. Appetite is good. No weight loss. He is having a BM daily. No melena or BRRB. No jaundice.  Joint pain from arthritis.  Hepatic Function Panel     Component Value Date/Time   PROT 7.4 09/17/2013 1529   ALBUMIN 4.4 09/17/2013 1529   AST 22 09/17/2013 1529   ALT 15 09/17/2013 1529   ALKPHOS 93 09/17/2013 1529   BILITOT 0.5 09/17/2013 1529   CBC    Component Value Date/Time   WBC 15.8* 09/17/2013 1529   RBC 5.06 09/17/2013 1529   HGB 15.8 09/17/2013 1529   HCT 45.7 09/17/2013 1529   PLT 248 09/17/2013 1529   MCV 90.3 09/17/2013 1529   MCH 31.2 09/17/2013 1529   MCHC 34.6 09/17/2013 1529   RDW 13.4 09/17/2013 1529   LYMPHSABS 2.0 03/12/2013 1554   MONOABS 0.5 03/12/2013 1554   EOSABS 0.2 03/12/2013 1554   BASOSABS 0.0 03/12/2013 1554          03/12/2013 Hep C quaint undetected. 10/08/2013 US abdomen:  IMPRESSION: Cholelithiasis without sonographic evidence of acute cholecystitis. No focal lesion identified within the liver. 12/11/97: Liver biopsy: Hepatic tissue showing chronic active hepatitis and cirrhosis, consistert with Hepatitis C infection.     01/25/2014: Colonoscopy  Indications: Patient is 64 year old Caucasian male with history of colonic adenomas who is here for surveillance colonoscopy. His last exam was over 5 years ago. Impression:  Examination performed to cecum. Three small polyps were cold snared and submitted together (two of these were located at hepatic flexure and the third one at  proximal transverse colon. Polyp at transverse colon was 6 mm and other two were smaller). Mild sigmoid colon diverticulosis. Internal hemorrhoids.    Review of Systems Past Medical History  Diagnosis Date  . Hepatitis C reactive   . Arthritis   . Neuropathy     ? etiology  . Hypertension     Past Surgical History  Procedure Laterality Date  . Renal carcinoa: rt kidney    . Colonoscopy N/A 01/25/2014    Procedure: COLONOSCOPY;  Surgeon: Rogene Houston, MD;  [facility];  Service: Endoscopy;  Laterality: N/A;  1120    Allergies  Allergen Reactions  . Lyrica [Pregabalin] Swelling    Swelling to feet  . Penicillins Other (See Comments)    Patient has never taken this medicine.  Had an allergy test done and was told he was allergic to penicillin.  Marland Kitchen Kwell [Lindane] Rash    Current Outpatient Prescriptions on File Prior to Visit  Medication Sig Dispense Refill  . amLODipine-benazepril (LOTREL) 10-40 MG per capsule Take 1 capsule by mouth daily.    . Ascorbic Acid (VITAMIN C) 100 MG tablet Take 500 mg by mouth daily.     Marland Kitchen aspirin EC 81 MG tablet Take 81 mg by mouth daily.    . cetirizine (ZYRTEC) 10 MG tablet Take 10 mg by mouth daily as needed for allergies.     Marland Kitchen  eletriptan (RELPAX) 40 MG tablet Take 40 mg by mouth as needed for migraine. One tablet by mouth at onset of headache. May repeat in 2 hours if headache persists or recurs.    . fluticasone (FLONASE) 50 MCG/ACT nasal spray Place 2 sprays into both nostrils daily.     . Ginkgo Biloba 120 MG CAPS Take 1 capsule by mouth daily.     Marland Kitchen KRILL OIL PO Take 1 capsule by mouth daily.     . milk thistle 175 MG tablet Take 175 mg by mouth daily.    . Multiple Vitamin (MULTIVITAMIN) tablet Take 1 tablet by mouth daily.     No current facility-administered medications on file prior to visit.        Objective:   Physical ExamBlood pressure 126/60, pulse 72, temperature 98.7 F (37.1 C), height 6\' 4"  (1.93 m),  weight 223 lb 1.6 oz (101.197 kg). Alert and oriented. Skin warm and dry. Oral mucosa is moist.   . Sclera anicteric, conjunctivae is pink. Thyroid not enlarged. No cervical lymphadenopathy. Lungs clear. Heart regular rate and rhythm.  Abdomen is soft. Bowel sounds are positive. No hepatomegaly. No abdominal masses felt. No tenderness.  No edema to lower extremities. Bruising noted to both forearm.         Assessment & Plan:    Hepatitis C. Doing well. Need surveillance [place], CBC, Hepatic function Hep C quaint, Hepatic profile. CBC OV 6 months.

## 2014-09-19 NOTE — Patient Instructions (Signed)
Labs and US today. OV in 6 months.  

## 2014-09-20 LAB — CBC WITH DIFFERENTIAL/PLATELET
BASOS ABS: 0.1 10*3/uL (ref 0.0–0.1)
BASOS PCT: 1 % (ref 0–1)
EOS PCT: 3 % (ref 0–5)
Eosinophils Absolute: 0.4 10*3/uL (ref 0.0–0.7)
HEMATOCRIT: 43.4 % (ref 39.0–52.0)
HEMOGLOBIN: 15.1 g/dL (ref 13.0–17.0)
Lymphocytes Relative: 30 % (ref 12–46)
Lymphs Abs: 4.3 10*3/uL — ABNORMAL HIGH (ref 0.7–4.0)
MCH: 30.9 pg (ref 26.0–34.0)
MCHC: 34.8 g/dL (ref 30.0–36.0)
MCV: 88.8 fL (ref 78.0–100.0)
MPV: 12.4 fL (ref 8.6–12.4)
Monocytes Absolute: 0.7 10*3/uL (ref 0.1–1.0)
Monocytes Relative: 5 % (ref 3–12)
NEUTROS ABS: 8.8 10*3/uL — AB (ref 1.7–7.7)
Neutrophils Relative %: 61 % (ref 43–77)
Platelets: 298 10*3/uL (ref 150–400)
RBC: 4.89 MIL/uL (ref 4.22–5.81)
RDW: 12.9 % (ref 11.5–15.5)
WBC: 14.4 10*3/uL — AB (ref 4.0–10.5)

## 2014-09-23 ENCOUNTER — Ambulatory Visit (HOSPITAL_COMMUNITY)
Admission: RE | Admit: 2014-09-23 | Discharge: 2014-09-23 | Disposition: A | Discharge status: Home / Self Care | Payer: Medicare Other | Source: Ambulatory Visit | Attending: Internal Medicine | Admitting: Internal Medicine

## 2014-09-23 DIAGNOSIS — B1920 Unspecified viral hepatitis C without hepatic coma: Secondary | ICD-10-CM | POA: Insufficient documentation

## 2014-09-23 DIAGNOSIS — K8020 Calculus of gallbladder without cholecystitis without obstruction: Secondary | ICD-10-CM | POA: Insufficient documentation

## 2014-09-23 DIAGNOSIS — C649 Malignant neoplasm of unspecified kidney, except renal pelvis: Secondary | ICD-10-CM | POA: Diagnosis not present

## 2014-09-23 DIAGNOSIS — Z905 Acquired absence of kidney: Secondary | ICD-10-CM | POA: Diagnosis not present

## 2014-09-23 DIAGNOSIS — R1084 Generalized abdominal pain: Secondary | ICD-10-CM | POA: Diagnosis not present

## 2014-09-23 LAB — HEPATITIS C RNA QUANTITATIVE: HCV Quantitative: NOT DETECTED IU/mL (ref <15)

## 2015-01-22 DIAGNOSIS — Z0000 Encounter for general adult medical examination without abnormal findings: Secondary | ICD-10-CM | POA: Diagnosis not present

## 2015-01-22 DIAGNOSIS — E7800 Pure hypercholesterolemia, unspecified: Secondary | ICD-10-CM | POA: Diagnosis not present

## 2015-01-22 DIAGNOSIS — I10 Essential (primary) hypertension: Secondary | ICD-10-CM | POA: Diagnosis not present

## 2015-01-22 DIAGNOSIS — Z79899 Other long term (current) drug therapy: Secondary | ICD-10-CM | POA: Diagnosis not present

## 2015-01-22 DIAGNOSIS — Z1389 Encounter for screening for other disorder: Secondary | ICD-10-CM | POA: Diagnosis not present

## 2015-01-22 DIAGNOSIS — Z7189 Other specified counseling: Secondary | ICD-10-CM | POA: Diagnosis not present

## 2015-01-22 DIAGNOSIS — Z418 Encounter for other procedures for purposes other than remedying health state: Secondary | ICD-10-CM | POA: Diagnosis not present

## 2015-01-22 DIAGNOSIS — Z6828 Body mass index (BMI) 28.0-28.9, adult: Secondary | ICD-10-CM | POA: Diagnosis not present

## 2015-01-22 DIAGNOSIS — Z1211 Encounter for screening for malignant neoplasm of colon: Secondary | ICD-10-CM | POA: Diagnosis not present

## 2015-01-22 DIAGNOSIS — Z125 Encounter for screening for malignant neoplasm of prostate: Secondary | ICD-10-CM | POA: Diagnosis not present

## 2015-01-22 DIAGNOSIS — R5383 Other fatigue: Secondary | ICD-10-CM | POA: Diagnosis not present

## 2015-02-24 DIAGNOSIS — H2513 Age-related nuclear cataract, bilateral: Secondary | ICD-10-CM | POA: Diagnosis not present

## 2015-02-24 DIAGNOSIS — H5203 Hypermetropia, bilateral: Secondary | ICD-10-CM | POA: Diagnosis not present

## 2015-02-24 DIAGNOSIS — H52223 Regular astigmatism, bilateral: Secondary | ICD-10-CM | POA: Diagnosis not present

## 2015-02-24 DIAGNOSIS — H524 Presbyopia: Secondary | ICD-10-CM | POA: Diagnosis not present

## 2015-03-17 ENCOUNTER — Encounter (INDEPENDENT_AMBULATORY_CARE_PROVIDER_SITE_OTHER): Payer: Self-pay | Admitting: Internal Medicine

## 2015-03-19 ENCOUNTER — Ambulatory Visit (INDEPENDENT_AMBULATORY_CARE_PROVIDER_SITE_OTHER): Payer: Medicare Other | Admitting: Internal Medicine

## 2015-04-02 ENCOUNTER — Encounter (INDEPENDENT_AMBULATORY_CARE_PROVIDER_SITE_OTHER): Payer: Self-pay | Admitting: *Deleted

## 2015-04-02 ENCOUNTER — Ambulatory Visit (INDEPENDENT_AMBULATORY_CARE_PROVIDER_SITE_OTHER): Payer: Medicare Other | Admitting: Internal Medicine

## 2015-04-02 ENCOUNTER — Encounter (INDEPENDENT_AMBULATORY_CARE_PROVIDER_SITE_OTHER): Payer: Self-pay | Admitting: Internal Medicine

## 2015-04-02 VITALS — BP 140/70 | HR 64 | Temp 98.4°F | Ht 76.0 in | Wt 226.2 lb

## 2015-04-02 DIAGNOSIS — B1920 Unspecified viral hepatitis C without hepatic coma: Secondary | ICD-10-CM | POA: Diagnosis not present

## 2015-04-02 NOTE — Progress Notes (Signed)
Subjective:    Patient ID: Jason Robinson, male    DOB: 1950-11-22, 65 y.o.   MRN: [id number]  HPI  HPI Here today for f/u of his Hepatitis C. He is genotype 1B. He failed tx in 2000  with Dr. Laural Golden.  He has biopsy proven cirrhosis secondary to Hepatitis C. He was treated with 12 weeks of Solvaldi, Pegasys and Co-Pegus. He finished therapy 02/12/2013. HCVRNA at end of tx was negative. He did not experience side effects with therapy. He tells me he is doing good. Appetite is good. No weight loss. He is having a BM daily. No melena or BRRB. Recently underwent a colonoscopy last year/ No jaundice. Joint pain from arthritis.   09/19/2014 Hep C quaint undetected.   09/23/2014 [place] elastrography: F2-F3.    01/25/2014 Colonoscopy  Indications: Patient is 65 year old Caucasian male with history of colonic adenomas who is here for surveillance colonoscopy. His last exam was over 5 years ago.  Impression:  Examination performed to cecum. Three small polyps were cold snared and submitted together (two of these were located at hepatic flexure and the third one at proximal transverse colon. Polyp at transverse colon was 6 mm and other two were smaller). Mild sigmoid colon diverticulosis. Internal hemorrhoids.  Patient had 3 small polyps removed from his colon. Two polyps are tubular adenoma and the third polyp is sessile serrated polyp No dysplasia identified Results reviewed with patient Next colonoscopy in 5 years Reports to PCP   Review of Systems Past Medical History  Diagnosis Date  . Hepatitis C reactive   . Arthritis   . Neuropathy ([place])     ? etiology  . Hypertension     Past Surgical History  Procedure Laterality Date  . Renal carcinoa: rt kidney    . Colonoscopy N/A 01/25/2014    Procedure: COLONOSCOPY;  Surgeon: Rogene Houston, MD;  [facility];  Service: Endoscopy;  Laterality: N/A;  1120    Allergies  Allergen Reactions  . Lyrica [Pregabalin]  Swelling    Swelling to feet  . Penicillins Other (See Comments)    Patient has never taken this medicine.  Had an allergy test done and was told he was allergic to penicillin.  Marland Kitchen Kwell [Lindane] Rash    Current Outpatient Prescriptions on File Prior to Visit  Medication Sig Dispense Refill  . amLODipine-benazepril (LOTREL) 10-40 MG per capsule Take 1 capsule by mouth daily.    . Ascorbic Acid (VITAMIN C) 100 MG tablet Take 500 mg by mouth daily.     Marland Kitchen aspirin EC 81 MG tablet Take 81 mg by mouth daily.    . cetirizine (ZYRTEC) 10 MG tablet Take 10 mg by mouth daily as needed for allergies.     Marland Kitchen eletriptan (RELPAX) 40 MG tablet Take 40 mg by mouth as needed for migraine. One tablet by mouth at onset of headache. May repeat in 2 hours if headache persists or recurs.    . fluticasone (FLONASE) 50 MCG/ACT nasal spray Place 2 sprays into both nostrils daily.     . Ginkgo Biloba 120 MG CAPS Take 1 capsule by mouth daily.     Marland Kitchen KRILL OIL PO Take 1 capsule by mouth daily.     . milk thistle 175 MG tablet Take 175 mg by mouth daily.    . Multiple Vitamin (MULTIVITAMIN) tablet Take 1 tablet by mouth daily.     No current facility-administered medications on file prior to visit.  Objective:   Physical Exam Blood pressure 140/70, pulse 64, temperature 98.4 F (36.9 C), height 6\' 4"  (1.93 m), weight 226 lb 3.2 oz (102.604 kg). Alert and oriented. Skin warm and dry. Oral mucosa is moist.   . Sclera anicteric, conjunctivae is pink. Thyroid not enlarged. No cervical lymphadenopathy. Lungs clear. Heart regular rate and rhythm.  Abdomen is soft. Bowel sounds are positive. No hepatomegaly. No abdominal masses felt. No tenderness.  No edema to lower extremities.        Assessment & Plan:  Hepatitis C. Needs liver  Surveillance [place] RUQ, CBC, and Hep C quaint. OV in 1 year.

## 2015-04-02 NOTE — Patient Instructions (Signed)
Labs and [place] RUQ.  OV in 1 year.

## 2015-04-03 LAB — HEPATIC FUNCTION PANEL
ALT: 12 U/L (ref 9–46)
AST: 18 U/L (ref 10–35)
Albumin: 4.3 g/dL (ref 3.6–5.1)
Alkaline Phosphatase: 107 U/L (ref 40–115)
BILIRUBIN DIRECT: 0.1 mg/dL (ref <=0.2)
Indirect Bilirubin: 0.4 mg/dL (ref 0.2–1.2)
Total Bilirubin: 0.5 mg/dL (ref 0.2–1.2)
Total Protein: 7.5 g/dL (ref 6.1–8.1)

## 2015-04-03 LAB — CBC WITH DIFFERENTIAL/PLATELET
BASOS ABS: 0.1 10*3/uL (ref 0.0–0.1)
Basophils Relative: 1 % (ref 0–1)
EOS ABS: 0.4 10*3/uL (ref 0.0–0.7)
EOS PCT: 3 % (ref 0–5)
HEMATOCRIT: 44.5 % (ref 39.0–52.0)
Hemoglobin: 15.5 g/dL (ref 13.0–17.0)
Lymphocytes Relative: 35 % (ref 12–46)
Lymphs Abs: 4.6 10*3/uL — ABNORMAL HIGH (ref 0.7–4.0)
MCH: 31.2 pg (ref 26.0–34.0)
MCHC: 34.8 g/dL (ref 30.0–36.0)
MCV: 89.5 fL (ref 78.0–100.0)
MONO ABS: 0.8 10*3/uL (ref 0.1–1.0)
MPV: 12 fL (ref 8.6–12.4)
Monocytes Relative: 6 % (ref 3–12)
Neutro Abs: 7.2 10*3/uL (ref 1.7–7.7)
Neutrophils Relative %: 55 % (ref 43–77)
PLATELETS: 268 10*3/uL (ref 150–400)
RBC: 4.97 MIL/uL (ref 4.22–5.81)
RDW: 13.3 % (ref 11.5–15.5)
WBC: 13.1 10*3/uL — AB (ref 4.0–10.5)

## 2015-04-03 LAB — HEPATITIS C RNA QUANTITATIVE: HCV Quantitative: NOT DETECTED IU/mL (ref <15)

## 2015-04-07 ENCOUNTER — Ambulatory Visit (HOSPITAL_COMMUNITY)
Admission: RE | Admit: 2015-04-07 | Discharge: 2015-04-07 | Disposition: A | Discharge status: Home / Self Care | Payer: Medicare Other | Source: Ambulatory Visit | Attending: Internal Medicine | Admitting: Internal Medicine

## 2015-04-07 DIAGNOSIS — K8020 Calculus of gallbladder without cholecystitis without obstruction: Secondary | ICD-10-CM | POA: Insufficient documentation

## 2015-04-07 DIAGNOSIS — B182 Chronic viral hepatitis C: Secondary | ICD-10-CM | POA: Insufficient documentation

## 2015-04-07 DIAGNOSIS — B1920 Unspecified viral hepatitis C without hepatic coma: Secondary | ICD-10-CM

## 2015-04-22 DIAGNOSIS — M1990 Unspecified osteoarthritis, unspecified site: Secondary | ICD-10-CM | POA: Diagnosis not present

## 2015-04-23 DIAGNOSIS — I10 Essential (primary) hypertension: Secondary | ICD-10-CM | POA: Diagnosis not present

## 2015-04-23 DIAGNOSIS — E7800 Pure hypercholesterolemia, unspecified: Secondary | ICD-10-CM | POA: Diagnosis not present

## 2015-05-30 DIAGNOSIS — I10 Essential (primary) hypertension: Secondary | ICD-10-CM | POA: Diagnosis not present

## 2015-05-30 DIAGNOSIS — E7800 Pure hypercholesterolemia, unspecified: Secondary | ICD-10-CM | POA: Diagnosis not present

## 2015-07-01 DIAGNOSIS — E7800 Pure hypercholesterolemia, unspecified: Secondary | ICD-10-CM | POA: Diagnosis not present

## 2015-07-01 DIAGNOSIS — I10 Essential (primary) hypertension: Secondary | ICD-10-CM | POA: Diagnosis not present

## 2015-07-28 DIAGNOSIS — I10 Essential (primary) hypertension: Secondary | ICD-10-CM | POA: Diagnosis not present

## 2015-07-28 DIAGNOSIS — E7800 Pure hypercholesterolemia, unspecified: Secondary | ICD-10-CM | POA: Diagnosis not present

## 2015-08-21 DIAGNOSIS — I10 Essential (primary) hypertension: Secondary | ICD-10-CM | POA: Diagnosis not present

## 2015-08-21 DIAGNOSIS — F17200 Nicotine dependence, unspecified, uncomplicated: Secondary | ICD-10-CM | POA: Diagnosis not present

## 2015-08-21 DIAGNOSIS — Z720 Tobacco use: Secondary | ICD-10-CM | POA: Diagnosis not present

## 2015-08-21 DIAGNOSIS — C649 Malignant neoplasm of unspecified kidney, except renal pelvis: Secondary | ICD-10-CM | POA: Diagnosis not present

## 2015-08-21 DIAGNOSIS — M1990 Unspecified osteoarthritis, unspecified site: Secondary | ICD-10-CM | POA: Diagnosis not present

## 2015-09-02 DIAGNOSIS — E7800 Pure hypercholesterolemia, unspecified: Secondary | ICD-10-CM | POA: Diagnosis not present

## 2015-09-02 DIAGNOSIS — I10 Essential (primary) hypertension: Secondary | ICD-10-CM | POA: Diagnosis not present

## 2015-09-03 DIAGNOSIS — Z23 Encounter for immunization: Secondary | ICD-10-CM | POA: Diagnosis not present

## 2015-09-10 ENCOUNTER — Ambulatory Visit (INDEPENDENT_AMBULATORY_CARE_PROVIDER_SITE_OTHER): Payer: Medicare Other | Admitting: Neurology

## 2015-09-10 ENCOUNTER — Encounter: Payer: Self-pay | Admitting: Neurology

## 2015-09-10 VITALS — BP 156/86 | HR 92 | Ht 76.0 in | Wt 236.0 lb

## 2015-09-10 DIAGNOSIS — G5600 Carpal tunnel syndrome, unspecified upper limb: Secondary | ICD-10-CM | POA: Insufficient documentation

## 2015-09-10 DIAGNOSIS — G5602 Carpal tunnel syndrome, left upper limb: Secondary | ICD-10-CM | POA: Diagnosis not present

## 2015-09-10 NOTE — Progress Notes (Signed)
[facility]    Provider:  Dr Jaynee Eagles Referring Provider: Glenda Chroman, MD Primary Care Physician:  Glenda Chroman, MD  CC:  Left hand numbness  HPI:  Jason Robinson is a 65 y.o. male here as a referral from Dr. Woody Seller for left hand pain and numbness. Patient reports his first 2 fingers get needles, numb, and just go limp and doesn't respond. Past medical history of hypertension, high cholesterol, migraine, hepatitis C. He also has a history of renal cancer removed 5 years ago. He has had pain and needles in the left hand for years in the first 3 digits. In the last few months they started going numb, and then weak in the last month, difficulty holding a pencil or anything, dropping objects, difficulty holding cane. Just the left hand. Right hand is unaffected. The symptoms are episodic throughout the day and possibly positional. The weakness is only with the numbness. No nocturnal awakenings for pain. He tries to shake it out. No nocturnal awakening mostly in the day, while driving, while sitting, when playing the banjo. Nothing makes it better. No pain just sensory changes and weakness and lasts 2-3 minutes and then comes back. No neck pain or radicular symptoms. No inciting events known, no trauma to that hand. Patient does have numbness in his legs as well below the knees and he attributes this to hepatitis C. No known neuromuscular disorder or neuropathy in his family history.  Reviewed notes, labs and imaging from outside physicians, which showed:   CBC in March 2017 was unremarkable except for elevated white blood cells. CMP in January 2017 was essentially normal except for alkaline phosphatase which was elevated at 121. Creatinine was normal with a value of 1 and an EGFR of 79 which is normal. TSH 1.72 also January 2017.  Review of Systems: Patient complains of symptoms per HPI as well as the following symptoms: Eye pain, numbness, dizziness, tremor, allergies. Pertinent  negatives per HPI. All others negative.   Social History   Social History  . Marital status: Widowed    Spouse name: N/A  . Number of children: 1  . Years of education: [employer]   Occupational History  . Retired     Social History Main Topics  . Smoking status: Current Every Day Smoker  . Smokeless tobacco: Never Used     Comment: 1 pack a day sine age 49.;  . Alcohol use No  . Drug use: No  . Sexual activity: Not on file   Other Topics Concern  . Not on file   Social History Narrative   Lives with mom   Caffeine use: Coffee/soda daily   Tea occass    History reviewed. No pertinent family history.  Past Medical History:  Diagnosis Date  . Arthritis   . Hepatitis C reactive   . Hypertension   . Neuropathy ([place])    ? etiology    Past Surgical History:  Procedure Laterality Date  . COLONOSCOPY N/A 01/25/2014   Procedure: COLONOSCOPY;  Surgeon: Rogene Houston, MD;  [facility];  Service: Endoscopy;  Laterality: N/A;  1120  . Renal carcinoa: rt kidney      Current Outpatient Prescriptions  Medication Sig Dispense Refill  . amLODipine-benazepril (LOTREL) 10-40 MG per capsule Take 1 capsule by mouth daily.    . Ascorbic Acid (VITAMIN C) 100 MG tablet Take 500 mg by mouth daily.     Marland Kitchen aspirin EC 81 MG tablet Take  81 mg by mouth daily.    . cetirizine (ZYRTEC) 10 MG tablet Take 10 mg by mouth daily as needed for allergies.     Marland Kitchen eletriptan (RELPAX) 40 MG tablet Take 40 mg by mouth as needed for migraine. One tablet by mouth at onset of headache. May repeat in 2 hours if headache persists or recurs.    . fluticasone (FLONASE) 50 MCG/ACT nasal spray Place 2 sprays into both nostrils daily.     . Ginkgo Biloba 120 MG CAPS Take 1 capsule by mouth daily.     Marland Kitchen KRILL OIL PO Take 300 mg by mouth daily.     . milk thistle 175 MG tablet Take 175 mg by mouth daily.    . Multiple Vitamin (MULTIVITAMIN) tablet Take 1 tablet by mouth daily.    . traMADol (ULTRAM)  50 MG tablet Take by mouth every 6 (six) hours as needed.     No current facility-administered medications for this visit.     Allergies as of 09/10/2015 - Review Complete 09/10/2015  Allergen Reaction Noted  . Lyrica [pregabalin] Swelling 01/17/2014  . Penicillins Other (See Comments) 07/17/2012  . Kwell [lindane] Rash 01/17/2014    Vitals: BP (!) 156/86 (BP Location: Left Arm, Patient Position: Sitting, Cuff Size: Normal)   Pulse 92   Ht '6\' 4"'  (1.93 m)   Wt 236 lb (107 kg)   BMI 28.73 kg/m  Last Weight:  Wt Readings from Last 1 Encounters:  09/10/15 236 lb (107 kg)   Last Height:   Ht Readings from Last 1 Encounters:  09/10/15 '6\' 4"'  (1.93 m)    Physical exam: Exam: Gen: NAD                     CV: RRR, no MRG. No Carotid Bruits. No peripheral edema, warm, nontender Eyes: Conjunctivae clear without exudates or hemorrhage  Neuro: Detailed Neurologic Exam  Speech:    Speech is normal; fluent and spontaneous with normal comprehension.  Cognition:    The patient is oriented to person, place, and time;     recent and remote memory intact;     language fluent;     normal attention, concentration,     fund of knowledge Cranial Nerves:    The pupils are equal, round, and reactive to light. Attempted funduscopic exam could not visualize due to small pupils . Visual fields are full to finger confrontation. Extraocular movements are intact. Trigeminal sensation is intact and the muscles of mastication are normal. The face is symmetric. The palate elevates in the midline. Hearing intact. Voice is normal. Shoulder shrug is normal. The tongue has normal motion without fasciculations.   Coordination:    Normal finger to nose and heel to shin  Gait:    Uses a cane, antalgic  Motor Observation:    Left mild postural tremor and left minimal thenar flattening Tone:    Normal muscle tone.    Posture:  Normal erect    Strength: minimally dec grip on left. Otherwise strength  is V/V in the upper and lower limbs.      Sensation: Decreased from the knees down to all modalities     Reflex Exam:  DTR's:    Absent AJs otherwise deep tendon reflexes in the upper and lower extremities are brisk for age and medical conditions bilaterally.   Toes:    The toes are equiv bilaterally.   Clonus:    Clonus is absent.  +Tinel's sign on  the wrist on the left, +Phalen's maneuver on the left    Assessment/Plan:  65 year old male here for evaluation of sensory changes and weakness in the left hand. Positive Tinel sign on the wrist and positive Phalen's maneuver the left suspected carpal tunnel syndrome.   Emg/ncs bilateral upper extremities including radial sensories and palmar comparisons.  Wear a wrist splint on the left. Discussed other conservative measures.  Sarina Ill, MD  [facility] [address] [place], [place] [address]  Phone [phone] Fax [phone]

## 2015-09-10 NOTE — Patient Instructions (Signed)
Remember to drink plenty of fluid, eat healthy meals and do not skip any meals. Try to eat protein with a every meal and eat a healthy snack such as fruit or nuts in between meals. Try to keep a regular sleep-wake schedule and try to exercise daily, particularly in the form of walking, 20-30 minutes a day, if you can.   As far as diagnostic testing: emg/ncs  I would like to see you back for emg/ncs, sooner if we need to. Please call us with any interim questions, concerns, problems, updates or refill requests.   Our phone number is [phone]. We also have an after hours call service for urgent matters and there is a physician on-call for urgent questions. For any emergencies you know to call 911 or go to the nearest emergency room   

## 2015-10-01 DIAGNOSIS — I10 Essential (primary) hypertension: Secondary | ICD-10-CM | POA: Diagnosis not present

## 2015-10-01 DIAGNOSIS — E7800 Pure hypercholesterolemia, unspecified: Secondary | ICD-10-CM | POA: Diagnosis not present

## 2015-10-16 ENCOUNTER — Ambulatory Visit (INDEPENDENT_AMBULATORY_CARE_PROVIDER_SITE_OTHER): Payer: Medicare Other | Admitting: Neurology

## 2015-10-16 ENCOUNTER — Ambulatory Visit (INDEPENDENT_AMBULATORY_CARE_PROVIDER_SITE_OTHER): Payer: Self-pay | Admitting: Neurology

## 2015-10-16 DIAGNOSIS — G5602 Carpal tunnel syndrome, left upper limb: Secondary | ICD-10-CM

## 2015-10-16 DIAGNOSIS — Z0289 Encounter for other administrative examinations: Secondary | ICD-10-CM

## 2015-10-16 DIAGNOSIS — G5603 Carpal tunnel syndrome, bilateral upper limbs: Secondary | ICD-10-CM | POA: Diagnosis not present

## 2015-10-16 NOTE — Progress Notes (Signed)
  [facility]    Provider:  Dr Jaynee Eagles Referring Provider: Glenda Chroman, MD Primary Care Physician:  Glenda Chroman, MD  History: 65 year old male here for evaluation of sensory changes and weakness in the left hand. Positive Tinel sign on the wrist and positive Phalen's maneuver the left suspected carpal tunnel syndrome.   Summary:   Nerve Conduction Studies were performed on the bilateral upper extremities.  The right median APB motor nerve was within normal limits. The right Median 2nd Digit sensory nerve showed prolonged distal peak latency ( 3.76ms, N<3.5). F Wave studies indicate that the right Median F wave has normal latency  The left median APB motor nerve was within normal limits. The left Median 2nd Digit sensory nerve showed prolonged distal peak latency (3.8 ms, N<3.5) and reduced amplitude (8uv, N>10) F Wave studies indicate that the left Median F wave has normal latency.   Bilateral Ulnar ADM motor nerves were within normal limits. F Wave studies indicate that the bilateral Ulnar F waves have normal latencies The bilateralUlnar 5th digit sensory nerves were within normal limits.  The bilateral Radial sensory nerves were within normal limits.  The right median/ulnar (palm) comparison nerve showed prolonged distal peak latency (Median Palm, 2.6 ms, N<2.2) and abnormal peak latency difference (Median Palm-Ulnar Palm, 0.5 ms, N<0.4) with a relative median delay.   The left median/ulnar (palm) comparison nerve showed prolonged distal peak latency (Median Palm, 2.3 ms, N<2.2) and abnormal peak latency difference (Median Palm-Ulnar Palm, 0.4 ms, N<0.4) with a relative median delay.   EMG needle study of selected bilateral upper extremity muscles was performed. The following muscles were normal: Deltoid, Triceps, Pronator Teres, Opponens Pollicis, First Dorsal Interosseous.  Conclusion: This is an abnormal study. There is electrophysiologic evidence of  bilateral mild Carpal Tunnel Syndrome. No suggestion of polyneuropathy or radiculopathy.   Sarina Ill, MD  [facility] [address] [place], [place] [address]  Phone [phone] Fax [phone]

## 2015-10-19 NOTE — Progress Notes (Signed)
See procedure note.

## 2015-10-19 NOTE — Procedures (Signed)
[facility]    Provider:  Dr Jaynee Eagles Referring Provider: Glenda Chroman, MD Primary Care Physician:  Glenda Chroman, MD  History: 65 year old male here for evaluation of sensory changes and weakness in the left hand. Positive Tinel sign on the wrist and positive Phalen's maneuver the left suspected carpal tunnel syndrome.   Summary:   Nerve Conduction Studies were performed on the bilateral upper extremities.  The right median APB motor nerve was within normal limits. The right Median 2nd Digit sensory nerve showed prolonged distal peak latency ( 3.67ms, N<3.5). F Wave studies indicate that the right Median F wave has normal latency  The left median APB motor nerve was within normal limits. The left Median 2nd Digit sensory nerve showed prolonged distal peak latency (3.8 ms, N<3.5) and reduced amplitude (8uv, N>10) F Wave studies indicate that the left Median F wave has normal latency.   Bilateral Ulnar ADM motor nerves were within normal limits. F Wave studies indicate that the bilateral Ulnar F waves have normal latencies The bilateralUlnar 5th digit sensory nerves were within normal limits.  The bilateral Radial sensory nerves were within normal limits.  The right median/ulnar (palm) comparison nerve showed prolonged distal peak latency (Median Palm, 2.6 ms, N<2.2) and abnormal peak latency difference (Median Palm-Ulnar Palm, 0.5 ms, N<0.4) with a relative median delay.   The left median/ulnar (palm) comparison nerve showed prolonged distal peak latency (Median Palm, 2.3 ms, N<2.2) and abnormal peak latency difference (Median Palm-Ulnar Palm, 0.4 ms, N<0.4) with a relative median delay.   EMG needle study of selected bilateral upper extremity muscles was performed. The following muscles were normal: Deltoid, Triceps, Pronator Teres, Opponens Pollicis, First Dorsal Interosseous.  Conclusion: This is an abnormal study. There is electrophysiologic evidence of  bilateral mild Carpal Tunnel Syndrome. No suggestion of polyneuropathy or radiculopathy.   Sarina Ill, MD  [facility] [address] [place], [place] [address]  Phone [phone] Fax [phone]

## 2015-10-30 DIAGNOSIS — I10 Essential (primary) hypertension: Secondary | ICD-10-CM | POA: Diagnosis not present

## 2015-10-30 DIAGNOSIS — E7800 Pure hypercholesterolemia, unspecified: Secondary | ICD-10-CM | POA: Diagnosis not present

## 2015-11-07 DIAGNOSIS — G5602 Carpal tunnel syndrome, left upper limb: Secondary | ICD-10-CM | POA: Diagnosis not present

## 2015-11-07 DIAGNOSIS — G5601 Carpal tunnel syndrome, right upper limb: Secondary | ICD-10-CM | POA: Diagnosis not present

## 2015-11-12 DIAGNOSIS — I10 Essential (primary) hypertension: Secondary | ICD-10-CM | POA: Diagnosis not present

## 2015-11-12 DIAGNOSIS — E7800 Pure hypercholesterolemia, unspecified: Secondary | ICD-10-CM | POA: Diagnosis not present

## 2015-12-01 DIAGNOSIS — G5601 Carpal tunnel syndrome, right upper limb: Secondary | ICD-10-CM | POA: Diagnosis not present

## 2015-12-12 DIAGNOSIS — E7800 Pure hypercholesterolemia, unspecified: Secondary | ICD-10-CM | POA: Diagnosis not present

## 2015-12-12 DIAGNOSIS — I10 Essential (primary) hypertension: Secondary | ICD-10-CM | POA: Diagnosis not present

## 2015-12-16 DIAGNOSIS — G5602 Carpal tunnel syndrome, left upper limb: Secondary | ICD-10-CM | POA: Diagnosis not present

## 2015-12-16 DIAGNOSIS — G5601 Carpal tunnel syndrome, right upper limb: Secondary | ICD-10-CM | POA: Diagnosis not present

## 2015-12-16 DIAGNOSIS — Z4789 Encounter for other orthopedic aftercare: Secondary | ICD-10-CM | POA: Diagnosis not present

## 2016-01-14 ENCOUNTER — Encounter (INDEPENDENT_AMBULATORY_CARE_PROVIDER_SITE_OTHER): Payer: Self-pay | Admitting: Internal Medicine

## 2016-01-23 DIAGNOSIS — G5602 Carpal tunnel syndrome, left upper limb: Secondary | ICD-10-CM | POA: Diagnosis not present

## 2016-01-23 DIAGNOSIS — Z4789 Encounter for other orthopedic aftercare: Secondary | ICD-10-CM | POA: Diagnosis not present

## 2016-01-23 DIAGNOSIS — G5601 Carpal tunnel syndrome, right upper limb: Secondary | ICD-10-CM | POA: Diagnosis not present

## 2016-01-28 DIAGNOSIS — F17200 Nicotine dependence, unspecified, uncomplicated: Secondary | ICD-10-CM | POA: Diagnosis not present

## 2016-01-28 DIAGNOSIS — Z7189 Other specified counseling: Secondary | ICD-10-CM | POA: Diagnosis not present

## 2016-01-28 DIAGNOSIS — Z79899 Other long term (current) drug therapy: Secondary | ICD-10-CM | POA: Diagnosis not present

## 2016-01-28 DIAGNOSIS — Z0000 Encounter for general adult medical examination without abnormal findings: Secondary | ICD-10-CM | POA: Diagnosis not present

## 2016-01-28 DIAGNOSIS — Z299 Encounter for prophylactic measures, unspecified: Secondary | ICD-10-CM | POA: Diagnosis not present

## 2016-01-28 DIAGNOSIS — Z1389 Encounter for screening for other disorder: Secondary | ICD-10-CM | POA: Diagnosis not present

## 2016-01-28 DIAGNOSIS — R5383 Other fatigue: Secondary | ICD-10-CM | POA: Diagnosis not present

## 2016-01-28 DIAGNOSIS — F17210 Nicotine dependence, cigarettes, uncomplicated: Secondary | ICD-10-CM | POA: Diagnosis not present

## 2016-01-28 DIAGNOSIS — Z1211 Encounter for screening for malignant neoplasm of colon: Secondary | ICD-10-CM | POA: Diagnosis not present

## 2016-01-28 DIAGNOSIS — Z125 Encounter for screening for malignant neoplasm of prostate: Secondary | ICD-10-CM | POA: Diagnosis not present

## 2016-02-02 DIAGNOSIS — E7800 Pure hypercholesterolemia, unspecified: Secondary | ICD-10-CM | POA: Diagnosis not present

## 2016-02-02 DIAGNOSIS — I10 Essential (primary) hypertension: Secondary | ICD-10-CM | POA: Diagnosis not present

## 2016-02-09 DIAGNOSIS — R5383 Other fatigue: Secondary | ICD-10-CM | POA: Diagnosis not present

## 2016-02-09 DIAGNOSIS — Z125 Encounter for screening for malignant neoplasm of prostate: Secondary | ICD-10-CM | POA: Diagnosis not present

## 2016-02-09 DIAGNOSIS — I719 Aortic aneurysm of unspecified site, without rupture: Secondary | ICD-10-CM | POA: Diagnosis not present

## 2016-02-18 DIAGNOSIS — E7800 Pure hypercholesterolemia, unspecified: Secondary | ICD-10-CM | POA: Diagnosis not present

## 2016-02-18 DIAGNOSIS — I10 Essential (primary) hypertension: Secondary | ICD-10-CM | POA: Diagnosis not present

## 2016-04-01 ENCOUNTER — Encounter (INDEPENDENT_AMBULATORY_CARE_PROVIDER_SITE_OTHER): Payer: Self-pay | Admitting: *Deleted

## 2016-04-01 ENCOUNTER — Encounter (INDEPENDENT_AMBULATORY_CARE_PROVIDER_SITE_OTHER): Payer: Self-pay | Admitting: Internal Medicine

## 2016-04-01 ENCOUNTER — Ambulatory Visit (INDEPENDENT_AMBULATORY_CARE_PROVIDER_SITE_OTHER): Payer: Medicare Other | Admitting: Internal Medicine

## 2016-04-01 VITALS — BP 160/90 | HR 76 | Temp 98.0°F | Ht 74.0 in | Wt 232.4 lb

## 2016-04-01 DIAGNOSIS — B182 Chronic viral hepatitis C: Secondary | ICD-10-CM | POA: Diagnosis not present

## 2016-04-01 DIAGNOSIS — K7030 Alcoholic cirrhosis of liver without ascites: Secondary | ICD-10-CM | POA: Diagnosis not present

## 2016-04-01 NOTE — Patient Instructions (Signed)
Labs and US. 

## 2016-04-01 NOTE — Progress Notes (Signed)
Subjective:    Patient ID: Jason Robinson, male    DOB: [birth year], 66 y.o.   MRN: [id number]  HPI  Here today for f/u. Hx of Hepatitis C. Last seen in March of 2017. Genotype 1B.  Has biopsy proven cirrhosis secondary to Hepatitis C. He was treated with Solvaldi, Pegasys and Ribavirin Finished therapy in 2015. HCV at end of tx was negative.  He tells me he is doing great. He is staying active. His mother is living with him. His appetite is good. No abdominal pain.  He has a BM daily. No melena or BRRB.  04/07/2015 [place] RUQ: Liver: No focal lesion identified. Coarse echotexture is noted suggesting diffuse hepatocellular disease.  Coarse echotexture of hepatic parenchyma is noted suggesting hepatocellular disease. No focal abnormality is noted. 04/02/2015 Hep C quaint undetected. CBC    Component Value Date/Time   WBC 13.1 (H) 04/02/2015 1518   RBC 4.97 04/02/2015 1518   HGB 15.5 04/02/2015 1518   HCT 44.5 04/02/2015 1518   PLT 268 04/02/2015 1518   MCV 89.5 04/02/2015 1518   MCH 31.2 04/02/2015 1518   MCHC 34.8 04/02/2015 1518   RDW 13.3 04/02/2015 1518   LYMPHSABS 4.6 (H) 04/02/2015 1518   MONOABS 0.8 04/02/2015 1518   EOSABS 0.4 04/02/2015 1518   BASOSABS 0.1 04/02/2015 1518   Hepatic Function Latest Ref Rng & Units 04/02/2015 09/19/2014 09/17/2013  Total Protein 6.1 - 8.1 g/dL 7.5 7.6 7.4  Albumin 3.6 - 5.1 g/dL 4.3 4.4 4.4  AST 10 - 35 U/L _0 ALT 9 - 46 U/L _1 Alk Phosphatase 40 - 115 U/L 107 104 93  Total Bilirubin 0.2 - 1.2 mg/dL 0.5 0.6 0.5  Bilirubin, Direct <=0.2 mg/dL 0.1 0.1 -    Review of Systems     Past Medical History:  Diagnosis Date  . Arthritis   . Hepatitis C reactive   . Hypertension   . Neuropathy ([place])    ? etiology    Past Surgical History:  Procedure Laterality Date  . COLONOSCOPY N/A 01/25/2014   Procedure: COLONOSCOPY;  Surgeon: Rogene Houston, MD;  Location: [facility];  Service: Endoscopy;  Laterality: N/A;  1120  .  Renal carcinoa: rt kidney      Allergies  Allergen Reactions  . Lyrica [Pregabalin] Swelling    Swelling to feet  . Penicillins Other (See Comments)    Patient has never taken this medicine.  Had an allergy test done and was told he was allergic to penicillin.  Marland Kitchen Kwell [Lindane] Rash    Current Outpatient Prescriptions on File Prior to Visit  Medication Sig Dispense Refill  . amLODipine-benazepril (LOTREL) 10-40 MG per capsule Take 1 capsule by mouth daily.    . Ascorbic Acid (VITAMIN C) 100 MG tablet Take 500 mg by mouth daily.     Marland Kitchen aspirin EC 81 MG tablet Take 81 mg by mouth daily.    . cetirizine (ZYRTEC) 10 MG tablet Take 10 mg by mouth daily as needed for allergies.     Marland Kitchen eletriptan (RELPAX) 40 MG tablet Take 40 mg by mouth as needed for migraine. One tablet by mouth at onset of headache. May repeat in 2 hours if headache persists or recurs.    . fluticasone (FLONASE) 50 MCG/ACT nasal spray Place 2 sprays into both nostrils daily.     . Ginkgo Biloba 120 MG CAPS Take 1 capsule by mouth daily.     Marland Kitchen  KRILL OIL PO Take 300 mg by mouth daily.     . milk thistle 175 MG tablet Take 175 mg by mouth daily.    . Multiple Vitamin (MULTIVITAMIN) tablet Take 1 tablet by mouth daily.    . traMADol (ULTRAM) 50 MG tablet Take by mouth every 6 (six) hours as needed.     No current facility-administered medications on file prior to visit.     Objective:   Physical Exam Blood pressure (!) 160/90, pulse 76, temperature 98 F (36.7 C), height _0  (1.88 m), weight 232 lb 6.4 oz (105.4 kg).  Alert and oriented. Skin warm and dry. Oral mucosa is moist.   . Sclera anicteric, conjunctivae is pink. Thyroid not enlarged. No cervical lymphadenopathy. Lungs clear. Heart regular rate and rhythm.  Abdomen is soft. Bowel sounds are positive. No hepatomegaly. No abdominal masses felt. No tenderness.  No edema to lower extremities.         Assessment & Plan:  Hepatitis C. Hepatic, AFP, [place] RUQ. CBC,  Hepatic. Hepatis C quaint.  OV in 1 year.

## 2016-04-02 LAB — CBC WITH DIFFERENTIAL/PLATELET
BASOS ABS: 153 {cells}/uL (ref 0–200)
Basophils Relative: 1 %
EOS PCT: 3 %
Eosinophils Absolute: 459 cells/uL (ref 15–500)
HCT: 44.6 % (ref 38.5–50.0)
Hemoglobin: 14.9 g/dL (ref 13.2–17.1)
LYMPHS PCT: 29 %
Lymphs Abs: 4437 cells/uL — ABNORMAL HIGH (ref 850–3900)
MCH: 30.2 pg (ref 27.0–33.0)
MCHC: 33.4 g/dL (ref 32.0–36.0)
MCV: 90.5 fL (ref 80.0–100.0)
MPV: 12.6 fL — ABNORMAL HIGH (ref 7.5–12.5)
Monocytes Absolute: 765 cells/uL (ref 200–950)
Monocytes Relative: 5 %
NEUTROS ABS: 9486 {cells}/uL — AB (ref 1500–7800)
Neutrophils Relative %: 62 %
Platelets: 274 10*3/uL (ref 140–400)
RBC: 4.93 MIL/uL (ref 4.20–5.80)
RDW: 13.9 % (ref 11.0–15.0)
WBC: 15.3 10*3/uL — ABNORMAL HIGH (ref 3.8–10.8)

## 2016-04-02 LAB — HEPATIC FUNCTION PANEL
ALBUMIN: 4.1 g/dL (ref 3.6–5.1)
ALK PHOS: 97 U/L (ref 40–115)
ALT: 15 U/L (ref 9–46)
AST: 19 U/L (ref 10–35)
Bilirubin, Direct: 0.1 mg/dL (ref <=0.2)
Indirect Bilirubin: 0.4 mg/dL (ref 0.2–1.2)
TOTAL PROTEIN: 7.5 g/dL (ref 6.1–8.1)
Total Bilirubin: 0.5 mg/dL (ref 0.2–1.2)

## 2016-04-02 LAB — AFP TUMOR MARKER: AFP TUMOR MARKER: 2.8 ng/mL (ref <6.1)

## 2016-04-05 ENCOUNTER — Ambulatory Visit (HOSPITAL_COMMUNITY)
Admission: RE | Admit: 2016-04-05 | Discharge: 2016-04-05 | Disposition: A | Discharge status: Home / Self Care | Payer: Medicare Other | Source: Ambulatory Visit | Attending: Internal Medicine | Admitting: Internal Medicine

## 2016-04-05 DIAGNOSIS — B182 Chronic viral hepatitis C: Secondary | ICD-10-CM

## 2016-04-05 DIAGNOSIS — K7030 Alcoholic cirrhosis of liver without ascites: Secondary | ICD-10-CM

## 2016-04-05 DIAGNOSIS — K7460 Unspecified cirrhosis of liver: Secondary | ICD-10-CM | POA: Insufficient documentation

## 2016-04-05 DIAGNOSIS — B1920 Unspecified viral hepatitis C without hepatic coma: Secondary | ICD-10-CM | POA: Diagnosis not present

## 2016-04-05 DIAGNOSIS — K8020 Calculus of gallbladder without cholecystitis without obstruction: Secondary | ICD-10-CM | POA: Diagnosis not present

## 2016-04-05 LAB — HEPATITIS C RNA QUANTITATIVE
HCV Quantitative Log: <1.18 Log IU/mL
HCV Quantitative: <15 IU/mL

## 2016-04-21 ENCOUNTER — Telehealth (INDEPENDENT_AMBULATORY_CARE_PROVIDER_SITE_OTHER): Payer: Self-pay | Admitting: Internal Medicine

## 2016-04-21 NOTE — Telephone Encounter (Signed)
Patient added to the recall list for one year.

## 2016-04-21 NOTE — Telephone Encounter (Signed)
-----   Message from Worthy Keeler sent at 04/06/2016  8:26 AM EDT ----- 6 mth [place] noted in recall, forwarded to [facility] for OV (need to verify 6 mth or 1 yr OV)

## 2016-04-21 NOTE — Telephone Encounter (Signed)
1year

## 2016-04-21 NOTE — Telephone Encounter (Signed)
Karna Christmas, will you please clarify if you want to see this patient in 6 months or 1 year.

## 2016-07-19 DIAGNOSIS — H2511 Age-related nuclear cataract, right eye: Secondary | ICD-10-CM | POA: Diagnosis not present

## 2016-07-19 DIAGNOSIS — H40013 Open angle with borderline findings, low risk, bilateral: Secondary | ICD-10-CM | POA: Diagnosis not present

## 2016-07-19 DIAGNOSIS — H2513 Age-related nuclear cataract, bilateral: Secondary | ICD-10-CM | POA: Diagnosis not present

## 2016-07-26 NOTE — Patient Instructions (Signed)
Your procedure is scheduled on:  08/03/2016  Report to [facility] at  1100   AM.  Call this number if you have problems the morning of surgery: [phone]   Do not eat food or drink liquids :After Midnight.      Take these medicines the morning of surgery with A SIP OF WATER: amlodipine, zyrtec, relapax and ultram (if needed).   Do not wear jewelry, make-up or nail polish.  Do not wear lotions, powders, or perfumes. You may wear deodorant.  Do not shave 48 hours prior to surgery.  Do not bring valuables to the hospital.  Contacts, dentures or bridgework may not be worn into surgery.  Leave suitcase in the car. After surgery it may be brought to your room.  For patients admitted to the hospital, checkout time is 11:00 AM the day of discharge.   Patients discharged the day of surgery will not be allowed to drive home.  :     Please read over the following fact sheets that you were given: Coughing and Deep Breathing, Surgical Site Infection Prevention, Anesthesia Post-op Instructions and Care and Recovery After Surgery    Cataract A cataract is a clouding of the lens of the eye. When a lens becomes cloudy, vision is reduced based on the degree and nature of the clouding. Many cataracts reduce vision to some degree. Some cataracts make people more near-sighted as they develop. Other cataracts increase glare. Cataracts that are ignored and become worse can sometimes look white. The white color can be seen through the pupil. CAUSES   Aging. However, cataracts may occur at any age, even in newborns.   Certain drugs.   Trauma to the eye.   Certain diseases such as diabetes.   Specific eye diseases such as chronic inflammation inside the eye or a sudden attack of a rare form of glaucoma.   Inherited or acquired medical problems.  SYMPTOMS   Gradual, progressive drop in vision in the affected eye.   Severe, rapid visual loss. This most often happens when trauma is the cause.  DIAGNOSIS   To detect a cataract, an eye doctor examines the lens. Cataracts are best diagnosed with an exam of the eyes with the pupils enlarged (dilated) by drops.  TREATMENT  For an early cataract, vision may improve by using different eyeglasses or stronger lighting. If that does not help your vision, surgery is the only effective treatment. A cataract needs to be surgically removed when vision loss interferes with your everyday activities, such as driving, reading, or watching TV. A cataract may also have to be removed if it prevents examination or treatment of another eye problem. Surgery removes the cloudy lens and usually replaces it with a substitute lens (intraocular lens, IOL).  At a time when both you and your doctor agree, the cataract will be surgically removed. If you have cataracts in both eyes, only one is usually removed at a time. This allows the operated eye to heal and be out of danger from any possible problems after surgery (such as infection or poor wound healing). In rare cases, a cataract may be doing damage to your eye. In these cases, your caregiver may advise surgical removal right away. The vast majority of people who have cataract surgery have better vision afterward. HOME CARE INSTRUCTIONS  If you are not planning surgery, you may be asked to do the following:  Use different eyeglasses.   Use stronger or brighter lighting.   Ask your  eye doctor about reducing your medicine dose or changing medicines if it is thought that a medicine caused your cataract. Changing medicines does not make the cataract go away on its own.   Become familiar with your surroundings. Poor vision can lead to injury. Avoid bumping into things on the affected side. You are at a higher risk for tripping or falling.   Exercise extreme care when driving or operating machinery.   Wear sunglasses if you are sensitive to bright light or experiencing problems with glare.  SEEK IMMEDIATE MEDICAL CARE IF:   You  have a worsening or sudden vision loss.   You notice redness, swelling, or increasing pain in the eye.   You have a fever.  Document Released: 12/21/2004 Document Revised: 12/10/2010 Document Reviewed: 08/14/2010 [facility] Patient Information 2012 [place].PATIENT INSTRUCTIONS POST-ANESTHESIA  IMMEDIATELY FOLLOWING SURGERY:  Do not drive or operate machinery for the first twenty four hours after surgery.  Do not make any important decisions for twenty four hours after surgery or while taking narcotic pain medications or sedatives.  If you develop intractable nausea and vomiting or a severe headache please notify your doctor immediately.  FOLLOW-UP:  Please make an appointment with your surgeon as instructed. You do not need to follow up with anesthesia unless specifically instructed to do so.  WOUND CARE INSTRUCTIONS (if applicable):  Keep a dry clean dressing on the anesthesia/puncture wound site if there is drainage.  Once the wound has quit draining you may leave it open to air.  Generally you should leave the bandage intact for twenty four hours unless there is drainage.  If the epidural site drains for more than 36-48 hours please call the anesthesia department.  QUESTIONS?:  Please feel free to call your physician or the hospital operator if you have any questions, and they will be happy to assist you.

## 2016-07-28 ENCOUNTER — Encounter (HOSPITAL_COMMUNITY)
Admission: RE | Admit: 2016-07-28 | Discharge: 2016-07-28 | Disposition: A | Discharge status: Home / Self Care | Payer: Medicare Other | Source: Ambulatory Visit | Attending: Ophthalmology | Admitting: Ophthalmology

## 2016-07-28 ENCOUNTER — Other Ambulatory Visit: Payer: Self-pay

## 2016-07-28 ENCOUNTER — Encounter (HOSPITAL_COMMUNITY): Payer: Self-pay

## 2016-07-28 DIAGNOSIS — Z01818 Encounter for other preprocedural examination: Secondary | ICD-10-CM | POA: Diagnosis not present

## 2016-07-28 HISTORY — DX: Malignant (primary) neoplasm, unspecified: C80.1

## 2016-07-28 HISTORY — DX: Myoneural disorder, unspecified: G70.9

## 2016-07-28 HISTORY — DX: Inflammatory liver disease, unspecified: K75.9

## 2016-07-28 HISTORY — DX: Cardiac murmur, unspecified: R01.1

## 2016-07-28 HISTORY — DX: Headache, unspecified: R51.9

## 2016-07-28 HISTORY — DX: Headache: R51

## 2016-07-28 HISTORY — DX: Gastro-esophageal reflux disease without esophagitis: K21.9

## 2016-07-28 LAB — BASIC METABOLIC PANEL
Anion gap: 10 (ref 5–15)
BUN: 15 mg/dL (ref 6–20)
CHLORIDE: 107 mmol/L (ref 101–111)
CO2: 21 mmol/L — AB (ref 22–32)
CREATININE: 0.97 mg/dL (ref 0.61–1.24)
Calcium: 9.2 mg/dL (ref 8.9–10.3)
GFR calc non Af Amer: >60 mL/min (ref >60)
GLUCOSE: 109 mg/dL — AB (ref 65–99)
Potassium: 3.8 mmol/L (ref 3.5–5.1)
Sodium: 138 mmol/L (ref 135–145)

## 2016-08-03 ENCOUNTER — Ambulatory Visit (HOSPITAL_COMMUNITY): Payer: Medicare Other | Admitting: Anesthesiology

## 2016-08-03 ENCOUNTER — Ambulatory Visit (HOSPITAL_COMMUNITY)
Admission: RE | Admit: 2016-08-03 | Discharge: 2016-08-03 | Disposition: A | Discharge status: Home / Self Care | Payer: Medicare Other | Source: Ambulatory Visit | Attending: Ophthalmology | Admitting: Ophthalmology

## 2016-08-03 ENCOUNTER — Encounter (HOSPITAL_COMMUNITY): Payer: Self-pay

## 2016-08-03 ENCOUNTER — Encounter (HOSPITAL_COMMUNITY)
Admission: RE | Disposition: A | Discharge status: Home / Self Care | Payer: Self-pay | Source: Ambulatory Visit | Attending: Ophthalmology

## 2016-08-03 DIAGNOSIS — Z880 Allergy status to penicillin: Secondary | ICD-10-CM | POA: Insufficient documentation

## 2016-08-03 DIAGNOSIS — F17200 Nicotine dependence, unspecified, uncomplicated: Secondary | ICD-10-CM | POA: Insufficient documentation

## 2016-08-03 DIAGNOSIS — H2511 Age-related nuclear cataract, right eye: Secondary | ICD-10-CM | POA: Insufficient documentation

## 2016-08-03 DIAGNOSIS — Z85528 Personal history of other malignant neoplasm of kidney: Secondary | ICD-10-CM | POA: Diagnosis not present

## 2016-08-03 DIAGNOSIS — Z79899 Other long term (current) drug therapy: Secondary | ICD-10-CM | POA: Insufficient documentation

## 2016-08-03 DIAGNOSIS — B1920 Unspecified viral hepatitis C without hepatic coma: Secondary | ICD-10-CM | POA: Diagnosis not present

## 2016-08-03 DIAGNOSIS — I10 Essential (primary) hypertension: Secondary | ICD-10-CM | POA: Insufficient documentation

## 2016-08-03 HISTORY — PX: CATARACT EXTRACTION W/PHACO: SHX586

## 2016-08-03 SURGERY — PHACOEMULSIFICATION, CATARACT, WITH IOL INSERTION
Anesthesia: Monitor Anesthesia Care | Site: Eye | Laterality: Right

## 2016-08-03 MED ORDER — LIDOCAINE HCL 3.5 % OP GEL
1.0000 "application " | Freq: Once | OPHTHALMIC | Status: AC
  Administered 2016-08-03: 1 via OPHTHALMIC

## 2016-08-03 MED ORDER — FENTANYL CITRATE (PF) 100 MCG/2ML IJ SOLN
INTRAMUSCULAR | Status: AC
  Filled 2016-08-03: qty 2

## 2016-08-03 MED ORDER — MIDAZOLAM HCL 2 MG/2ML IJ SOLN
INTRAMUSCULAR | Status: AC
  Filled 2016-08-03: qty 2

## 2016-08-03 MED ORDER — NEOMYCIN-POLYMYXIN-DEXAMETH 3.5-10000-0.1 OP SUSP
OPHTHALMIC | Status: DC | PRN
  Administered 2016-08-03: 2 [drp] via OPHTHALMIC

## 2016-08-03 MED ORDER — CYCLOPENTOLATE-PHENYLEPHRINE 0.2-1 % OP SOLN
1.0000 [drp] | OPHTHALMIC | Status: AC
  Administered 2016-08-03 (×3): 1 [drp] via OPHTHALMIC

## 2016-08-03 MED ORDER — FENTANYL CITRATE (PF) 100 MCG/2ML IJ SOLN
25.0000 ug | Freq: Once | INTRAMUSCULAR | Status: AC
  Administered 2016-08-03: 25 ug via INTRAVENOUS

## 2016-08-03 MED ORDER — LACTATED RINGERS IV SOLN
INTRAVENOUS | Status: DC
  Administered 2016-08-03: 10:00:00 via INTRAVENOUS

## 2016-08-03 MED ORDER — MIDAZOLAM HCL 2 MG/2ML IJ SOLN
1.0000 mg | INTRAMUSCULAR | Status: AC
  Administered 2016-08-03: 2 mg via INTRAVENOUS

## 2016-08-03 MED ORDER — POVIDONE-IODINE 5 % OP SOLN
OPHTHALMIC | Status: DC | PRN
  Administered 2016-08-03: 1 via OPHTHALMIC

## 2016-08-03 MED ORDER — PROVISC 10 MG/ML IO SOLN
INTRAOCULAR | Status: DC | PRN
  Administered 2016-08-03: 0.85 mL via INTRAOCULAR

## 2016-08-03 MED ORDER — TETRACAINE HCL 0.5 % OP SOLN
1.0000 [drp] | OPHTHALMIC | Status: AC
  Administered 2016-08-03 (×3): 1 [drp] via OPHTHALMIC

## 2016-08-03 MED ORDER — LIDOCAINE HCL (PF) 1 % IJ SOLN
INTRAMUSCULAR | Status: DC | PRN
  Administered 2016-08-03: .6 mL

## 2016-08-03 MED ORDER — PHENYLEPHRINE HCL 2.5 % OP SOLN
1.0000 [drp] | OPHTHALMIC | Status: AC
  Administered 2016-08-03 (×3): 1 [drp] via OPHTHALMIC

## 2016-08-03 MED ORDER — BSS IO SOLN
INTRAOCULAR | Status: DC | PRN
  Administered 2016-08-03: 500 mL

## 2016-08-03 MED ORDER — BSS IO SOLN
INTRAOCULAR | Status: DC | PRN
  Administered 2016-08-03: 15 mL via INTRAOCULAR

## 2016-08-03 MED ORDER — LIDOCAINE HCL 3.5 % OP GEL
OPHTHALMIC | Status: DC | PRN
  Administered 2016-08-03: 1 via OPHTHALMIC

## 2016-08-03 SURGICAL SUPPLY — 10 items

## 2016-08-03 NOTE — Anesthesia Preprocedure Evaluation (Signed)
Anesthesia Evaluation  Patient identified by MRN, date of birth, ID band Patient awake    Reviewed: Allergy & Precautions, NPO status , Patient's Chart, lab work & pertinent test results  Airway Mallampati: II  TM Distance: >3 FB Neck ROM: Full    Dental  (+) Poor Dentition, Loose, Missing,    Pulmonary Current Smoker,    breath sounds clear to auscultation       Cardiovascular hypertension,  Rhythm:Regular Rate:Normal     Neuro/Psych  Headaches,  Neuromuscular disease    GI/Hepatic GERD  ,(+) Hepatitis -, C  Endo/Other    Renal/GU Renal disease ( History of renal cell carcinoma)     Musculoskeletal   Abdominal   Peds  Hematology   Anesthesia Other Findings   Reproductive/Obstetrics                             Anesthesia Physical Anesthesia Plan  ASA: III  Anesthesia Plan: MAC   Post-op Pain Management:    Induction: Intravenous  PONV Risk Score and Plan:   Airway Management Planned: Nasal Cannula  Additional Equipment:   Intra-op Plan:   Post-operative Plan:   Informed Consent: I have reviewed the patients History and Physical, chart, labs and discussed the procedure including the risks, benefits and alternatives for the proposed anesthesia with the patient or authorized representative who has indicated his/her understanding and acceptance.     Plan Discussed with:   Anesthesia Plan Comments:         Anesthesia Quick Evaluation

## 2016-08-03 NOTE — Discharge Instructions (Signed)

## 2016-08-03 NOTE — Transfer of Care (Signed)
Immediate Anesthesia Transfer of Care Note  Patient: Jason Robinson  Procedure(s) Performed: Procedure(s) with comments: CATARACT EXTRACTION PHACO AND INTRAOCULAR LENS PLACEMENT (IOC) (Right) - CDE: 16.49  Patient Location: Short Stay  Anesthesia Type:MAC  Level of Consciousness: awake and patient cooperative  Airway & Oxygen Therapy: Patient Spontanous Breathing  Post-op Assessment: Report given to RN, Post -op Vital signs reviewed and stable and Patient moving all extremities  Post vital signs: Reviewed and stable  Last Vitals:  Vitals:   08/03/16 0955 08/03/16 1000  BP: 120/63 116/61  Pulse:    Resp: 17 11  Temp:      Last Pain:  Vitals:   08/03/16 0905  TempSrc: Oral         Complications: No apparent anesthesia complications

## 2016-08-03 NOTE — Op Note (Signed)
Date of Admission: 08/03/2016  Date of Surgery: 08/03/2016  Pre-Op Dx: Cataract Right  Eye  Post-Op Dx: Senile Nuclear Cataract  Right  Eye,  Dx Code H25.11  Surgeon: Tonny Branch, M.D.  Assistants: None  Anesthesia: Topical with MAC  Indications: Painless, progressive loss of vision with compromise of daily activities.  Surgery: Cataract Extraction with Intraocular lens Implant Right Eye  Discription: The patient had dilating drops and viscous lidocaine placed into the Right eye in the pre-op holding area. After transfer to the operating room, a time out was performed. The patient was then prepped and draped. Beginning with a 82m blade a paracentesis port was made at the surgeon's 2 o'clock position. The anterior chamber was then filled with 1% non-preserved lidocaine. This was followed by filling the anterior chamber with Provisc.  A 2.454mkeratome blade was used to make a clear corneal incision at the temporal limbus.  A bent cystatome needle was used to create a continuous tear capsulotomy. Hydrodissection was performed with balanced salt solution on a Fine canula. The lens nucleus was then removed using the phacoemulsification handpiece. Residual cortex was removed with the I&A handpiece. The anterior chamber and capsular bag were refilled with Provisc. A posterior chamber intraocular lens was placed into the capsular bag with it's injector. The implant was positioned with the Kuglan hook. The Provisc was then removed from the anterior chamber and capsular bag with the I&A handpiece. Stromal hydration of the main incision and paracentesis port was performed with BSS on a Fine canula. The wounds were tested for leak which was negative. The patient tolerated the procedure well. There were no operative complications. The patient was then transferred to the recovery room in stable condition.  Complications: None  Specimen: None  EBL: None  Prosthetic device: Abbott Technis, PCB00, power 21.5,  SN [id number]

## 2016-08-03 NOTE — H&P (Signed)
I have reviewed the H&P, the patient was re-examined, and I have identified no interval changes in medical condition and plan of care since the history and physical of record  

## 2016-08-03 NOTE — Anesthesia Postprocedure Evaluation (Signed)
Anesthesia Post Note  Patient: ANTRELL TIPLER  Procedure(s) Performed: Procedure(s) (LRB): CATARACT EXTRACTION PHACO AND INTRAOCULAR LENS PLACEMENT (IOC) (Right)  Patient location during evaluation: Short Stay Anesthesia Type: MAC Level of consciousness: awake and alert and patient cooperative Pain management: pain level controlled Vital Signs Assessment: post-procedure vital signs reviewed and stable Respiratory status: spontaneous breathing, nonlabored ventilation and respiratory function stable Cardiovascular status: blood pressure returned to baseline Postop Assessment: no signs of nausea or vomiting Anesthetic complications: no     Last Vitals:  Vitals:   08/03/16 0955 08/03/16 1000  BP: 120/63 116/61  Pulse:    Resp: 17 11  Temp:      Last Pain:  Vitals:   08/03/16 0905  TempSrc: Oral                 Drezden Seitzinger J

## 2016-08-23 DIAGNOSIS — H2512 Age-related nuclear cataract, left eye: Secondary | ICD-10-CM | POA: Diagnosis not present

## 2016-08-25 ENCOUNTER — Encounter (HOSPITAL_COMMUNITY)
Admission: RE | Admit: 2016-08-25 | Discharge: 2016-08-25 | Disposition: A | Discharge status: Home / Self Care | Payer: Medicare Other | Source: Ambulatory Visit | Attending: Ophthalmology | Admitting: Ophthalmology

## 2016-08-25 ENCOUNTER — Encounter (HOSPITAL_COMMUNITY): Payer: Self-pay

## 2016-08-30 ENCOUNTER — Encounter (HOSPITAL_COMMUNITY): Payer: Self-pay

## 2016-08-30 ENCOUNTER — Encounter (HOSPITAL_COMMUNITY)
Admission: RE | Disposition: A | Discharge status: Home / Self Care | Payer: Self-pay | Source: Ambulatory Visit | Attending: Ophthalmology

## 2016-08-30 ENCOUNTER — Ambulatory Visit (HOSPITAL_COMMUNITY): Payer: Medicare Other | Admitting: Anesthesiology

## 2016-08-30 ENCOUNTER — Ambulatory Visit (HOSPITAL_COMMUNITY)
Admission: RE | Admit: 2016-08-30 | Discharge: 2016-08-30 | Disposition: A | Discharge status: Home / Self Care | Payer: Medicare Other | Source: Ambulatory Visit | Attending: Ophthalmology | Admitting: Ophthalmology

## 2016-08-30 DIAGNOSIS — H2512 Age-related nuclear cataract, left eye: Secondary | ICD-10-CM | POA: Insufficient documentation

## 2016-08-30 DIAGNOSIS — I10 Essential (primary) hypertension: Secondary | ICD-10-CM | POA: Diagnosis not present

## 2016-08-30 DIAGNOSIS — Z880 Allergy status to penicillin: Secondary | ICD-10-CM | POA: Diagnosis not present

## 2016-08-30 DIAGNOSIS — Z8619 Personal history of other infectious and parasitic diseases: Secondary | ICD-10-CM | POA: Diagnosis not present

## 2016-08-30 DIAGNOSIS — K219 Gastro-esophageal reflux disease without esophagitis: Secondary | ICD-10-CM | POA: Insufficient documentation

## 2016-08-30 DIAGNOSIS — M1990 Unspecified osteoarthritis, unspecified site: Secondary | ICD-10-CM | POA: Insufficient documentation

## 2016-08-30 DIAGNOSIS — F17200 Nicotine dependence, unspecified, uncomplicated: Secondary | ICD-10-CM | POA: Insufficient documentation

## 2016-08-30 HISTORY — PX: CATARACT EXTRACTION W/PHACO: SHX586

## 2016-08-30 SURGERY — PHACOEMULSIFICATION, CATARACT, WITH IOL INSERTION
Anesthesia: Monitor Anesthesia Care | Site: Eye | Laterality: Left

## 2016-08-30 MED ORDER — LACTATED RINGERS IV SOLN
INTRAVENOUS | Status: DC
  Administered 2016-08-30: 09:00:00 via INTRAVENOUS

## 2016-08-30 MED ORDER — NA HYALUR & NA CHOND-NA HYALUR 0.55-0.5 ML IO KIT
PACK | INTRAOCULAR | Status: DC | PRN
  Administered 2016-08-30: 1 via OPHTHALMIC

## 2016-08-30 MED ORDER — BSS IO SOLN
INTRAOCULAR | Status: DC | PRN
  Administered 2016-08-30: 15 mL

## 2016-08-30 MED ORDER — LIDOCAINE HCL (PF) 1 % IJ SOLN
INTRAMUSCULAR | Status: DC | PRN
  Administered 2016-08-30: .6 mL

## 2016-08-30 MED ORDER — CYCLOPENTOLATE-PHENYLEPHRINE 0.2-1 % OP SOLN
1.0000 [drp] | OPHTHALMIC | Status: AC
  Administered 2016-08-30 (×3): 1 [drp] via OPHTHALMIC

## 2016-08-30 MED ORDER — NEOMYCIN-POLYMYXIN-DEXAMETH 3.5-10000-0.1 OP SUSP
OPHTHALMIC | Status: DC | PRN
  Administered 2016-08-30: 2 [drp] via OPHTHALMIC

## 2016-08-30 MED ORDER — ONDANSETRON 4 MG PO TBDP
4.0000 mg | ORAL_TABLET | Freq: Once | ORAL | Status: AC
  Administered 2016-08-30: 4 mg via ORAL
  Filled 2016-08-30: qty 1

## 2016-08-30 MED ORDER — BSS IO SOLN
INTRAOCULAR | Status: DC | PRN
  Administered 2016-08-30: 500 mL

## 2016-08-30 MED ORDER — LIDOCAINE HCL 3.5 % OP GEL
1.0000 "application " | Freq: Once | OPHTHALMIC | Status: AC
  Administered 2016-08-30: 1 via OPHTHALMIC

## 2016-08-30 MED ORDER — TETRACAINE HCL 0.5 % OP SOLN
1.0000 [drp] | OPHTHALMIC | Status: AC
  Administered 2016-08-30 (×3): 1 [drp] via OPHTHALMIC

## 2016-08-30 MED ORDER — MIDAZOLAM HCL 2 MG/2ML IJ SOLN
1.0000 mg | Freq: Once | INTRAMUSCULAR | Status: AC
  Administered 2016-08-30: 1 mg via INTRAVENOUS

## 2016-08-30 MED ORDER — PHENYLEPHRINE HCL 2.5 % OP SOLN
1.0000 [drp] | OPHTHALMIC | Status: AC
  Administered 2016-08-30 (×3): 1 [drp] via OPHTHALMIC

## 2016-08-30 MED ORDER — MIDAZOLAM HCL 2 MG/2ML IJ SOLN
1.0000 mg | Freq: Once | INTRAMUSCULAR | Status: AC | PRN
  Administered 2016-08-30: 1 mg via INTRAVENOUS
  Filled 2016-08-30: qty 2

## 2016-08-30 MED ORDER — POVIDONE-IODINE 5 % OP SOLN
OPHTHALMIC | Status: DC | PRN
  Administered 2016-08-30: 1 via OPHTHALMIC

## 2016-08-30 SURGICAL SUPPLY — 10 items
CLOTH BEACON ORANGE TIMEOUT ST (SAFETY) ×2 IMPLANT
EYE SHIELD UNIVERSAL CLEAR (GAUZE/BANDAGES/DRESSINGS) ×2 IMPLANT
GLOVE BIOGEL PI IND STRL 7.0 (GLOVE) ×2 IMPLANT
GLOVE BIOGEL PI INDICATOR 7.0 (GLOVE) ×2
LENS ALC ACRYL/TECN (Ophthalmic Related) ×2 IMPLANT
PAD ARMBOARD 7.5X6 YLW CONV (MISCELLANEOUS) ×2 IMPLANT
SYRINGE LUER LOK 1CC (MISCELLANEOUS) ×2 IMPLANT
TAPE SURG TRANSPORE 1 IN (GAUZE/BANDAGES/DRESSINGS) ×1 IMPLANT
TAPE SURGICAL TRANSPORE 1 IN (GAUZE/BANDAGES/DRESSINGS) ×1
WATER STERILE IRR 250ML POUR (IV SOLUTION) ×2 IMPLANT

## 2016-08-30 NOTE — H&P (Signed)
I have reviewed the H&P, the patient was re-examined, and I have identified no interval changes in medical condition and plan of care since the history and physical of record  

## 2016-08-30 NOTE — Anesthesia Preprocedure Evaluation (Signed)
Anesthesia Evaluation    Airway Mallampati: I  TM Distance: >3 FB Neck ROM: Full    Dental  (+) Poor Dentition   Pulmonary Current Smoker,    Pulmonary exam normal breath sounds clear to auscultation       Cardiovascular Exercise Tolerance: Good hypertension, Pt. on medications Normal cardiovascular exam Rhythm:Regular Rate:Normal     Neuro/Psych    GI/Hepatic GERD  Medicated and Controlled,(+) Hepatitis -, CMost recent test, stable not detected   Endo/Other    Renal/GU Renal diseasestable     Musculoskeletal  (+) Arthritis , Osteoarthritis,    Abdominal (+)  Abdomen: soft.    Peds  Hematology   Anesthesia Other Findings   Reproductive/Obstetrics                             Anesthesia Physical Anesthesia Plan  ASA: III  Anesthesia Plan: MAC   Post-op Pain Management:    Induction:   PONV Risk Score and Plan:   Airway Management Planned: Nasal Cannula and Natural Airway  Additional Equipment:   Intra-op Plan:   Post-operative Plan:   Informed Consent: I have reviewed the patients History and Physical, chart, labs and discussed the procedure including the risks, benefits and alternatives for the proposed anesthesia with the patient or authorized representative who has indicated his/her understanding and acceptance.   Dental advisory given  Plan Discussed with: CRNA  Anesthesia Plan Comments:         Anesthesia Quick Evaluation

## 2016-08-30 NOTE — Op Note (Addendum)
Date of Admission: 08/30/2016  Date of Surgery: 08/30/2016  Pre-Op Dx: Cataract Left  Eye  Post-Op Dx: Senile Nuclear Cataract  Left  Eye,  Dx Code H25.12  Surgeon: Tonny Branch, M.D.  Assistants: None  Anesthesia: Topical with MAC  Indications: Painless, progressive loss of vision with compromise of daily activities.  Surgery: Cataract Extraction with Intraocular lens Implant Left Eye  Discription: The patient had dilating drops and viscous lidocaine placed into the Left eye in the pre-op holding area. After transfer to the operating room, a time out was performed. The patient was then prepped and draped. Beginning with a 57m blade a paracentesis port was made at the surgeon's 2 o'clock position. The anterior chamber was then filled with 1% non-preserved lidocaine. This was followed by filling the anterior chamber with Provisc.  A 2.445mkeratome blade was used to make a clear corneal incision at the temporal limbus.  A bent cystatome needle was used to create a continuous tear capsulotomy. Hydrodissection was performed with balanced salt solution on a Fine canula. The lens nucleus was then removed using the phacoemulsification handpiece. Residual cortex was removed with the I&A handpiece. The anterior chamber and capsular bag were refilled with Provisc. A posterior chamber intraocular lens was placed into the capsular bag with it's injector. The implant was positioned with the Kuglan hook. The Provisc was then removed from the anterior chamber and capsular bag with the I&A handpiece. Stromal hydration of the main incision and paracentesis port was performed with BSS on a Fine canula. The wounds were tested for leak which was negative. The patient tolerated the procedure well. There were no operative complications. The patient was then transferred to the recovery room in stable condition.  Complications: None  Specimen: None  EBL: None  Prosthetic device: Abbott Technis, PCB00, power 22.0  #[id number]

## 2016-08-30 NOTE — Transfer of Care (Signed)
Immediate Anesthesia Transfer of Care Note  Patient: Audie Box  Procedure(s) Performed: Procedure(s) with comments: CATARACT EXTRACTION PHACO AND INTRAOCULAR LENS PLACEMENT (IOC) (Left) - CDE: 9.71  Patient Location: Short Stay  Anesthesia Type:MAC  Level of Consciousness: awake and alert   Airway & Oxygen Therapy: Patient Spontanous Breathing  Post-op Assessment: Report given to RN and Post -op Vital signs reviewed and stable  Post vital signs: Reviewed and stable  Last Vitals:  Vitals:   08/30/16 0930 08/30/16 0935  BP: 131/66 109/62  Pulse:    Resp: (!) 28 (!) (P) 28  Temp:    SpO2: 96% 94%    Last Pain:  Vitals:   08/30/16 0900  TempSrc: Oral         Complications: No apparent anesthesia complications

## 2016-08-30 NOTE — Anesthesia Procedure Notes (Signed)
Procedure Name: MAC Date/Time: 08/30/2016 10:00 AM Performed by: Vista Deck Pre-anesthesia Checklist: Patient identified, Emergency Drugs available, Suction available, Timeout performed and Patient being monitored Patient Re-evaluated:Patient Re-evaluated prior to induction Oxygen Delivery Method: Nasal Cannula

## 2016-08-30 NOTE — Discharge Instructions (Signed)

## 2016-08-30 NOTE — Anesthesia Postprocedure Evaluation (Signed)
Anesthesia Post Note  Patient: Jason Robinson  Procedure(s) Performed: Procedure(s) (LRB): CATARACT EXTRACTION PHACO AND INTRAOCULAR LENS PLACEMENT (IOC) (Left)  Patient location during evaluation: Short Stay Anesthesia Type: MAC Level of consciousness: awake and alert Pain management: pain level controlled Vital Signs Assessment: post-procedure vital signs reviewed and stable Respiratory status: spontaneous breathing Cardiovascular status: stable Postop Assessment: no signs of nausea or vomiting Anesthetic complications: no     Last Vitals:  Vitals:   08/30/16 0930 08/30/16 0935  BP: 131/66 109/62  Pulse:    Resp: (!) 28 (!) (P) 28  Temp:    SpO2: 96% 94%    Last Pain:  Vitals:   08/30/16 0900  TempSrc: Oral                 Abbigal Radich

## 2016-08-31 ENCOUNTER — Encounter (HOSPITAL_COMMUNITY): Payer: Self-pay | Admitting: Ophthalmology

## 2016-09-13 DIAGNOSIS — Z23 Encounter for immunization: Secondary | ICD-10-CM | POA: Diagnosis not present

## 2016-10-01 ENCOUNTER — Encounter (INDEPENDENT_AMBULATORY_CARE_PROVIDER_SITE_OTHER): Payer: Self-pay | Admitting: *Deleted

## 2016-10-25 ENCOUNTER — Encounter (INDEPENDENT_AMBULATORY_CARE_PROVIDER_SITE_OTHER): Payer: Self-pay | Admitting: *Deleted

## 2017-02-08 DIAGNOSIS — G43909 Migraine, unspecified, not intractable, without status migrainosus: Secondary | ICD-10-CM | POA: Diagnosis not present

## 2017-02-08 DIAGNOSIS — F17210 Nicotine dependence, cigarettes, uncomplicated: Secondary | ICD-10-CM | POA: Diagnosis not present

## 2017-02-08 DIAGNOSIS — Z1211 Encounter for screening for malignant neoplasm of colon: Secondary | ICD-10-CM | POA: Diagnosis not present

## 2017-02-08 DIAGNOSIS — Z0000 Encounter for general adult medical examination without abnormal findings: Secondary | ICD-10-CM | POA: Diagnosis not present

## 2017-02-08 DIAGNOSIS — Z299 Encounter for prophylactic measures, unspecified: Secondary | ICD-10-CM | POA: Diagnosis not present

## 2017-02-08 DIAGNOSIS — Z125 Encounter for screening for malignant neoplasm of prostate: Secondary | ICD-10-CM | POA: Diagnosis not present

## 2017-02-08 DIAGNOSIS — Z6828 Body mass index (BMI) 28.0-28.9, adult: Secondary | ICD-10-CM | POA: Diagnosis not present

## 2017-02-08 DIAGNOSIS — Z7189 Other specified counseling: Secondary | ICD-10-CM | POA: Diagnosis not present

## 2017-02-08 DIAGNOSIS — Z1339 Encounter for screening examination for other mental health and behavioral disorders: Secondary | ICD-10-CM | POA: Diagnosis not present

## 2017-02-08 DIAGNOSIS — R5383 Other fatigue: Secondary | ICD-10-CM | POA: Diagnosis not present

## 2017-02-08 DIAGNOSIS — Z79899 Other long term (current) drug therapy: Secondary | ICD-10-CM | POA: Diagnosis not present

## 2017-02-08 DIAGNOSIS — B182 Chronic viral hepatitis C: Secondary | ICD-10-CM | POA: Diagnosis not present

## 2017-02-08 DIAGNOSIS — Z1331 Encounter for screening for depression: Secondary | ICD-10-CM | POA: Diagnosis not present

## 2017-02-08 DIAGNOSIS — I10 Essential (primary) hypertension: Secondary | ICD-10-CM | POA: Diagnosis not present

## 2017-02-13 IMAGING — US US ABDOMEN COMPLETE W/ ELASTOGRAPHY
1 series · 12 of 25 positions shown · non-contrast
Comparison: 10/08/2013 abdominal sonogram.

CLINICAL DATA: 64-year-old male with chronic hepatitis-C. Reported
history of liver biopsy, right partial nephrectomy for renal cancer
and cholelithiasis.



[Series 1: us abdomen complete w/ elastography · 0.21mm/px · 12 of 114 slices shown]
[im 5/114]
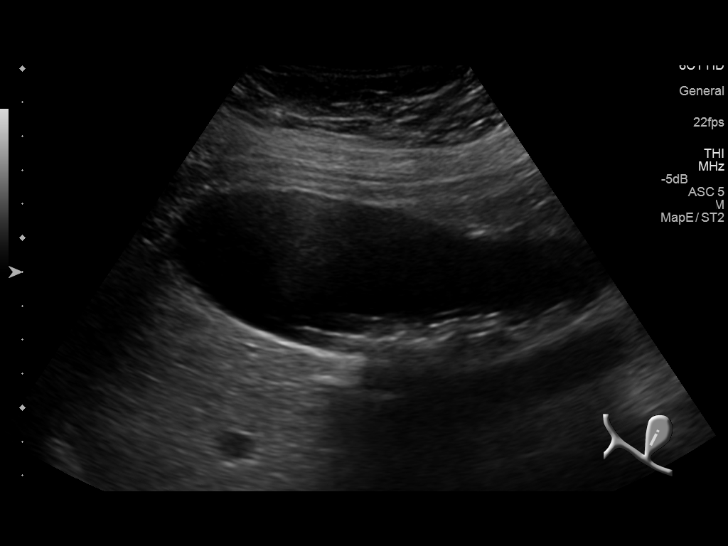
[im 15/114]
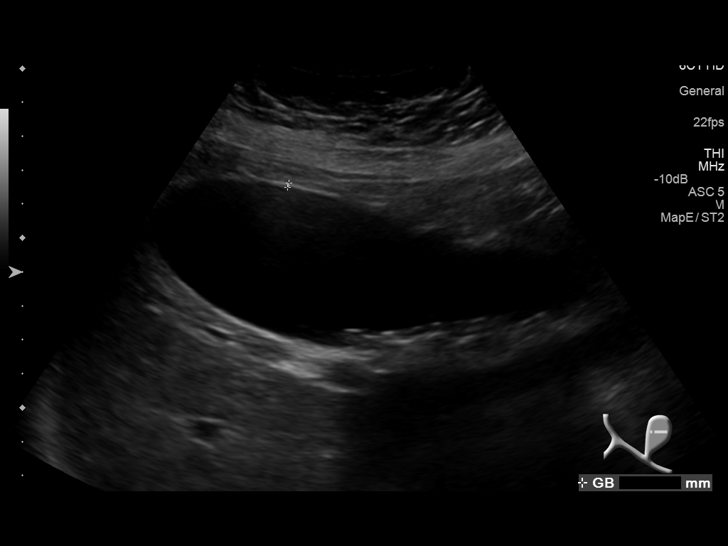
[im 24/114]
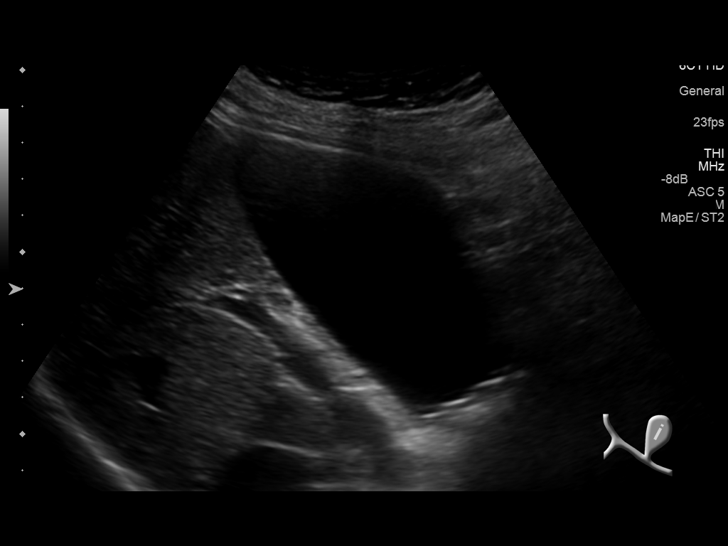
[im 33/114]
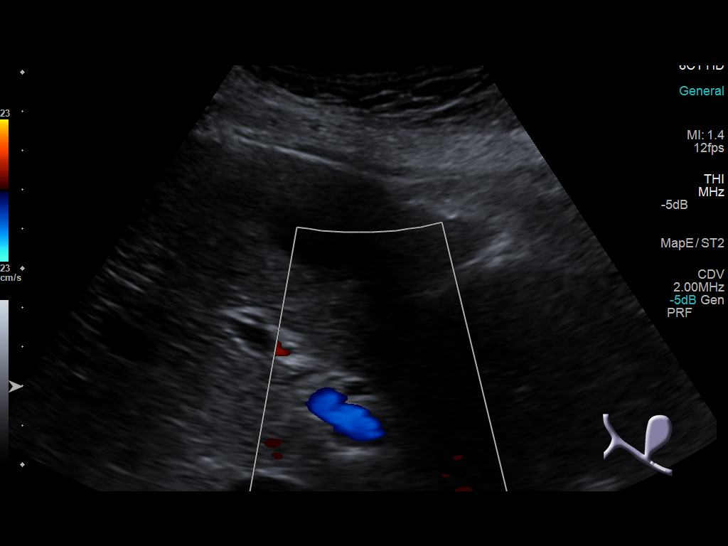
[im 43/114]
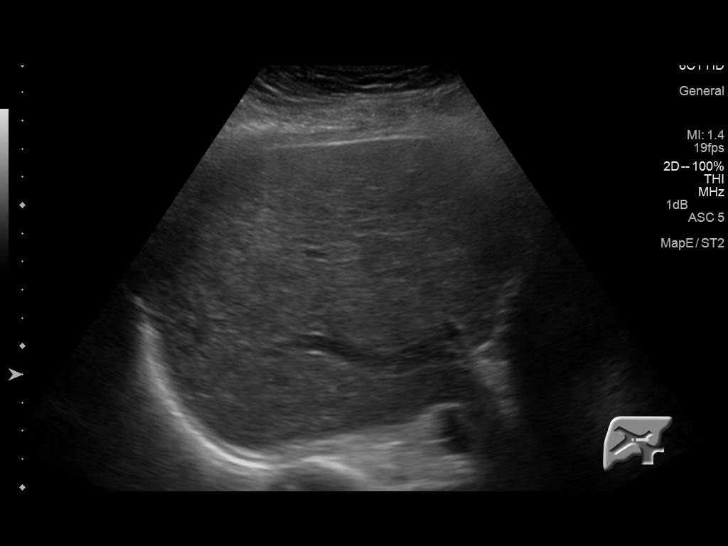
[im 52/114]
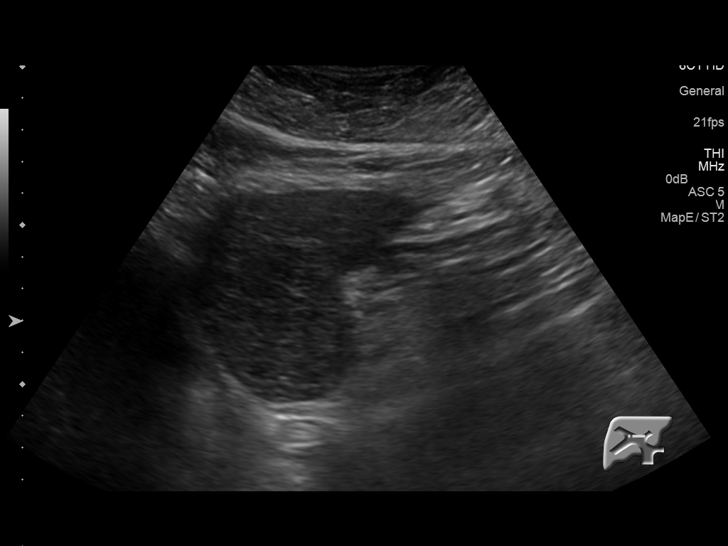
[im 62/114]
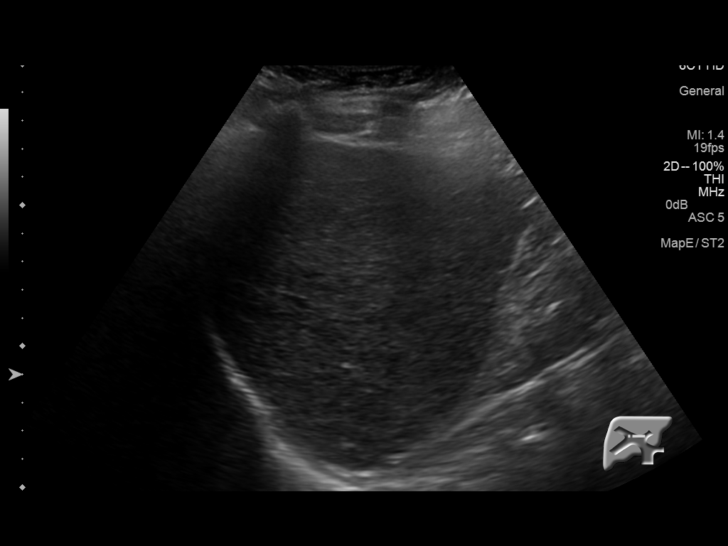
[im 71/114]
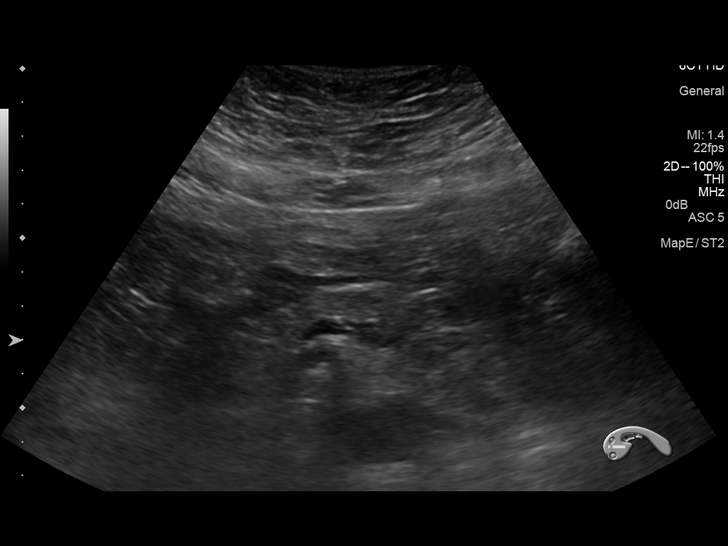
[im 81/114]
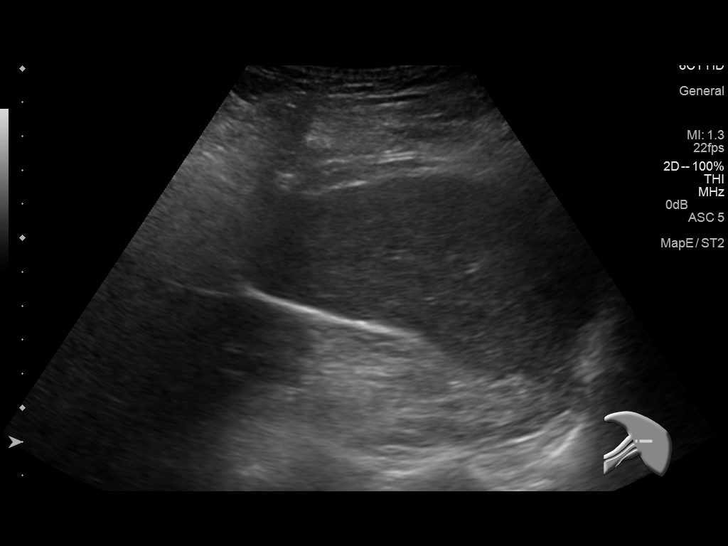
[im 90/114]
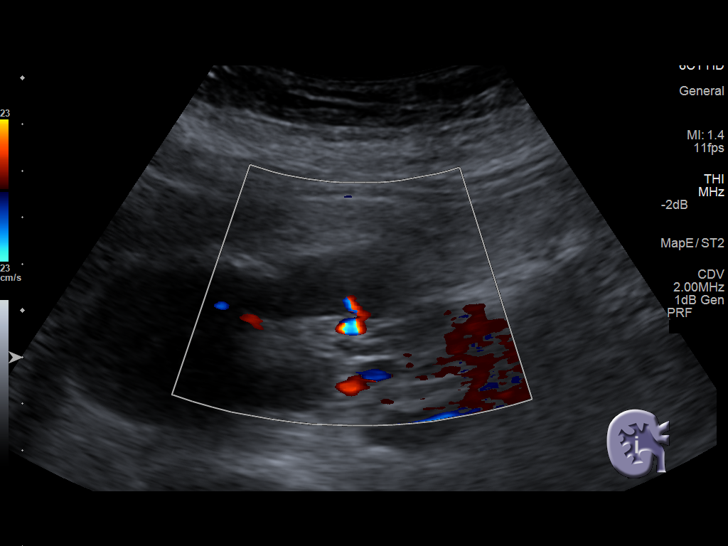
[im 99/114]
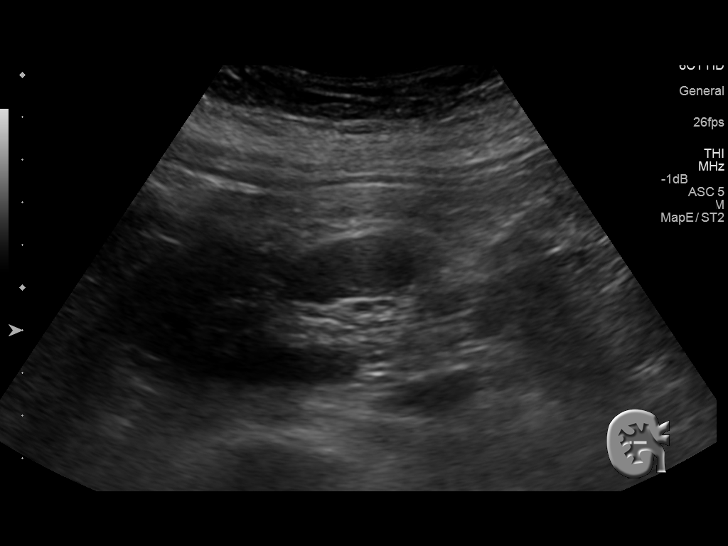
[im 109/114]
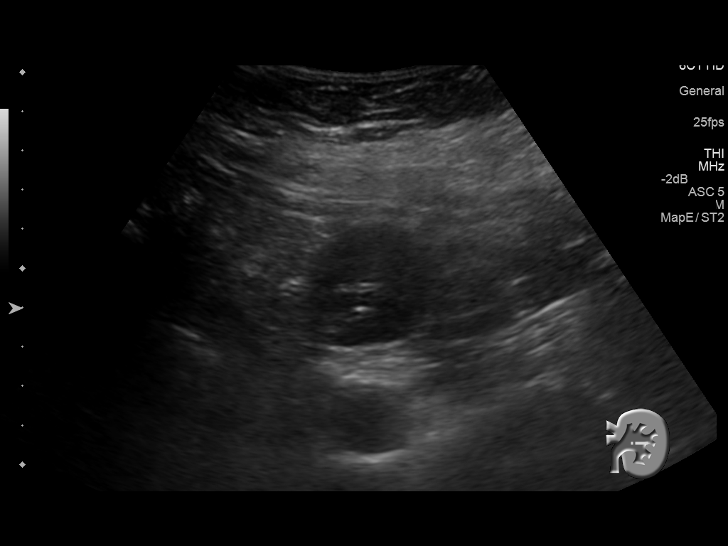

[12 of 25 positions shown; findings below may reference images not displayed]

FINDINGS: ULTRASOUND ABDOMEN

Gallbladder: There are numerous layering shadowing calcified
gallstones within the gallbladder, measuring up to 0.8 cm in size.
No gallbladder wall thickening, pericholecystic fluid or sonographic
Murphy's sign.

Common bile duct: Diameter: 4 mm.

Liver: The liver parenchyma is diffusely coarsened in echotexture.
No definite liver surface irregularity is demonstrated. The liver
parenchymal echogenicity is normal. No liver mass is detected. The
main portal vein is patent with appropriate flow direction.

IVC: No abnormality visualized.

Pancreas: Visualized portion unremarkable.

Spleen: Size and appearance within normal limits. Craniocaudal
splenic length 11.8 cm.

Right Kidney: Length: 11.5 cm. Echogenicity within normal limits. No
hydronephrosis. There is mild renal contour irregularity in the mid
right kidney from prior partial nephrectomy, with no definite mass
detected at sonography in the right kidney.

Left Kidney: Length: 12.8 cm. Echogenicity within normal limits. No
mass or hydronephrosis.

Abdominal aorta: No aneurysm visualized.

Other findings: None.

ULTRASOUND HEPATIC ELASTOGRAPHY

Device: Siemens Helix VTQ

Transducer [id number]

Patient position: Left lateral decubitus

Number of measurements:  10

Hepatic Segment:  8

Median velocity:   1.52  m/sec

IQR:

IQR/Median velocity ratio

Corresponding Metavir fibrosis score:  F2 + some F3

Risk of fibrosis: Moderate

Limitations of exam: Increased sample death (5.4 cm).

Pertinent findings noted on other imaging exams:  None

Please note that abnormal shear wave velocities may also be
identified in clinical settings other than with hepatic fibrosis,
such as: acute hepatitis, elevated right heart and central venous
pressures including use of beta blockers, Xhenuina disease
([place]), infiltrative processes such as
mastocytosis/amyloidosis/infiltrative tumor, extrahepatic
cholestasis, in the post-prandial state, and liver transplantation.
Correlation with patient history, laboratory data, and clinical
condition recommended.
IMPRESSION: 1. Coarsened liver echotexture, a nonspecific finding suggesting
fibrosis. No macroscopic evidence of cirrhosis. No liver mass
detected.
2. No secondary findings of portal hypertension.
3. Cholelithiasis.  No biliary ductal dilatation.
4. Status post partial right nephrectomy, with no definite recurrent
right renal mass.
5. Hepatic elastography results:
Median hepatic shear wave velocity is calculated at 1.52 m/sec.

Corresponding Metavir fibrosis score is F2 + some F3.

Risk of fibrosis is moderate.

Follow-up:  Additional testing appropriate.

## 2017-02-24 DIAGNOSIS — Z79899 Other long term (current) drug therapy: Secondary | ICD-10-CM | POA: Diagnosis not present

## 2017-04-13 ENCOUNTER — Encounter (INDEPENDENT_AMBULATORY_CARE_PROVIDER_SITE_OTHER): Payer: Self-pay | Admitting: *Deleted

## 2017-04-25 ENCOUNTER — Ambulatory Visit (INDEPENDENT_AMBULATORY_CARE_PROVIDER_SITE_OTHER): Payer: Medicare Other | Admitting: Internal Medicine

## 2017-04-26 ENCOUNTER — Encounter (INDEPENDENT_AMBULATORY_CARE_PROVIDER_SITE_OTHER): Payer: Self-pay | Admitting: *Deleted

## 2017-04-26 ENCOUNTER — Ambulatory Visit (INDEPENDENT_AMBULATORY_CARE_PROVIDER_SITE_OTHER): Payer: Medicare Other | Admitting: Internal Medicine

## 2017-04-26 ENCOUNTER — Encounter (INDEPENDENT_AMBULATORY_CARE_PROVIDER_SITE_OTHER): Payer: Self-pay | Admitting: Internal Medicine

## 2017-04-26 VITALS — BP 142/82 | HR 60 | Temp 97.5°F | Ht 76.0 in | Wt 230.2 lb

## 2017-04-26 DIAGNOSIS — B182 Chronic viral hepatitis C: Secondary | ICD-10-CM | POA: Diagnosis not present

## 2017-04-26 NOTE — Patient Instructions (Signed)
[place] RUQ OV in 1 year.

## 2017-04-26 NOTE — Progress Notes (Signed)
Subjective:    Patient ID: Jason Robinson, male    DOB: [birth year], 67 y.o.   MRN: [id number]  HPI Here today for f/u. Last seen in March of 2018. Hx of Hepatitis C Treated with Solvaldi, Pegasys and Ribavirin back in 2015. Viral load at and of tx was negative.  Genotype 1B. Biopsy proven cirrhosis secondary to Hepatitis C. Hep C Quain in 2018 was not detected. He tells me he quit smoking recently. He is vaping. He is doing good. Appetite is good. He has maintained his weight. BMs move normally most of the time. No melena or BRRB. He says he is exercising by helping out in the house.     04/23/2016 [place] RUQ: IMPRESSION: 1. Liver has a heterogeneous parenchymal pattern with nodular contour consistent with the patient's known cirrhosis. No focal hepatic abnormality identified.  2. Gallstones and sludge noted. No gallbladder wall thickening or biliary distention. Review of Systems Past Medical History:  Diagnosis Date  . Arthritis   . Cancer ([place])   . GERD (gastroesophageal reflux disease)   . Headache   . Heart murmur   . Hepatitis   . Hepatitis C reactive   . Hypertension   . Neuromuscular disorder ([place])    Necropathy bilateral lower legs  . Neuropathy    ? etiology  . Neuropathy    Bilateral lower legs    Past Surgical History:  Procedure Laterality Date  . CARPAL TUNNEL RELEASE Right   . CATARACT EXTRACTION W/PHACO Right 08/03/2016   Procedure: CATARACT EXTRACTION PHACO AND INTRAOCULAR LENS PLACEMENT (IOC);  Surgeon: Tonny Branch, MD;  Location: [facility];  Service: Ophthalmology;  Laterality: Right;  CDE: 16.49  . CATARACT EXTRACTION W/PHACO Left 08/30/2016   Procedure: CATARACT EXTRACTION PHACO AND INTRAOCULAR LENS PLACEMENT (IOC);  Surgeon: Tonny Branch, MD;  Location: [facility];  Service: Ophthalmology;  Laterality: Left;  CDE: 9.71  . COLONOSCOPY N/A 01/25/2014   Procedure: COLONOSCOPY;  Surgeon: Rogene Houston, MD;  Location: [facility];  Service: Endoscopy;   Laterality: N/A;  1120  . Renal carcinoa: rt kidney      Allergies  Allergen Reactions  . Lyrica [Pregabalin] Swelling    Swelling to feet  . Penicillins Other (See Comments)    Patient has never taken this medicine.  Had an allergy test done and was told he was allergic to penicillin. Has patient had a PCN reaction causing immediate rash, facial/tongue/throat swelling, SOB or lightheadedness with hypotension: No Has patient had a PCN reaction causing severe rash involving mucus membranes or skin necrosis: No Has patient had a PCN reaction that required hospitalization: No Has patient had a PCN reaction occurring within the last 10 years: No If all of the above answers are  . Kwell [Lindane] Rash    Current Outpatient Medications on File Prior to Visit  Medication Sig Dispense Refill  . amLODipine-benazepril (LOTREL) 10-40 MG per capsule Take 1 capsule by mouth daily.    Marland Kitchen aspirin EC 81 MG tablet Take 81 mg by mouth daily.    . cetirizine (ZYRTEC) 10 MG tablet Take 10 mg by mouth daily.     Marland Kitchen docusate sodium (COLACE) 100 MG capsule Take 100 mg by mouth daily.    . fluticasone (FLONASE) 50 MCG/ACT nasal spray Place 2 sprays into both nostrils daily.     . Ginkgo Biloba 120 MG CAPS Take 120 mg by mouth daily.     Marland Kitchen KRILL OIL PO Take 1 capsule by mouth  daily.     . milk thistle 175 MG tablet Take 175 mg by mouth daily.    . Multiple Vitamin (MULTIVITAMIN) tablet Take 1 tablet by mouth daily.    . rizatriptan (MAXALT-MLT) 10 MG disintegrating tablet Take 10 mg by mouth every 2 (two) hours as needed for migraine. May repeat in 2 hours if needed    . traMADol (ULTRAM) 50 MG tablet Take 50 mg by mouth every 6 (six) hours as needed for moderate pain.     . vitamin C (ASCORBIC ACID) 500 MG tablet Take 500 mg by mouth daily.     No current facility-administered medications on file prior to visit.         Objective:   Physical Exam Blood pressure (!) 142/82, pulse 60, temperature (!)  97.5 F (36.4 C), height 6\' 4"  (1.93 m), weight 230 lb 3.2 oz (104.4 kg). Alert and oriented. Skin warm and dry. Oral mucosa is moist.   . Sclera anicteric, conjunctivae is pink. Thyroid not enlarged. No cervical lymphadenopathy. Lungs clear. Heart regular rate and rhythm.  Abdomen is soft. Bowel sounds are positive. No hepatomegaly. No abdominal masses felt. No tenderness.  No edema to lower extremities.          Assessment & Plan:  Hepatitis C. Will get an [place] RUQ. Hep C quaint undetected last year.  OV in 1 year

## 2017-05-02 ENCOUNTER — Ambulatory Visit (HOSPITAL_COMMUNITY)
Admission: RE | Admit: 2017-05-02 | Discharge: 2017-05-02 | Disposition: A | Discharge status: Home / Self Care | Payer: Medicare Other | Source: Ambulatory Visit | Attending: Internal Medicine | Admitting: Internal Medicine

## 2017-05-02 DIAGNOSIS — B182 Chronic viral hepatitis C: Secondary | ICD-10-CM

## 2017-05-02 DIAGNOSIS — K8020 Calculus of gallbladder without cholecystitis without obstruction: Secondary | ICD-10-CM | POA: Diagnosis not present

## 2017-05-02 DIAGNOSIS — K7460 Unspecified cirrhosis of liver: Secondary | ICD-10-CM | POA: Diagnosis not present

## 2017-06-02 DIAGNOSIS — R7989 Other specified abnormal findings of blood chemistry: Secondary | ICD-10-CM | POA: Diagnosis not present

## 2017-06-17 DIAGNOSIS — Z23 Encounter for immunization: Secondary | ICD-10-CM | POA: Diagnosis not present

## 2017-08-28 IMAGING — US US ABDOMEN LIMITED
1 series · 14 of 25 positions shown · non-contrast
Comparison: Ultrasound September 23, 2014

CLINICAL DATA: Chronic hepatitis-C.

EXAM:
US ABDOMEN LIMITED - RIGHT UPPER QUADRANT

[Series 1: us abdomen limited · 0.22mm/px · 14 of 49 slices shown]
[im 1/49]
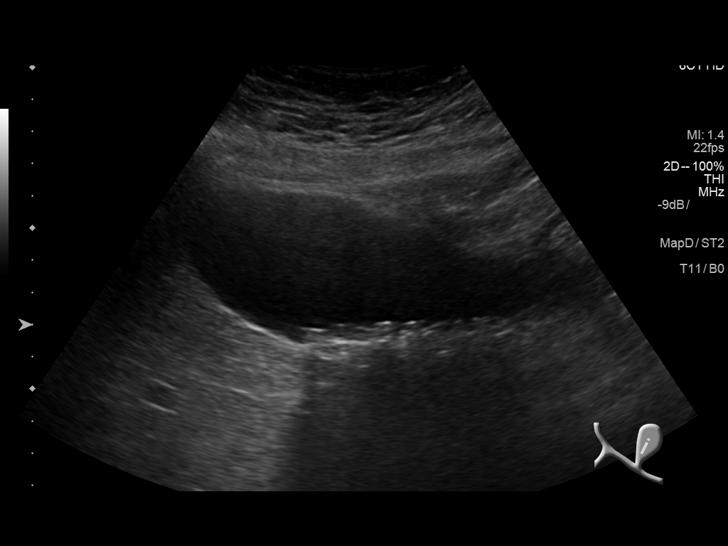
[im 5/49]
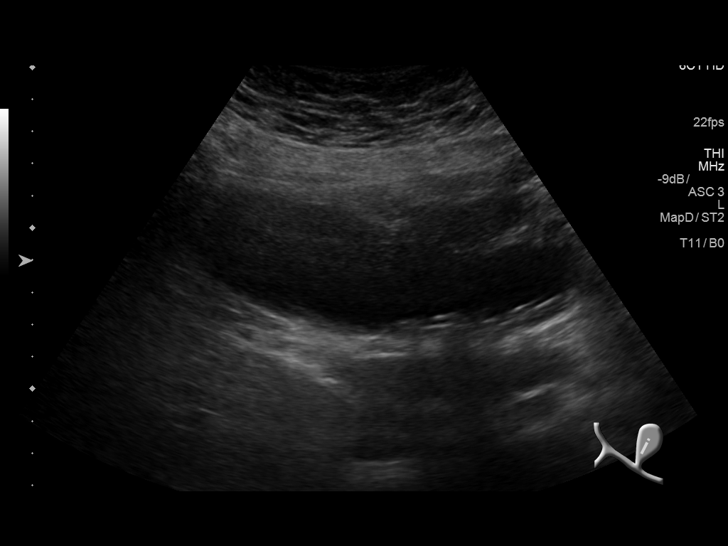
[im 9/49]
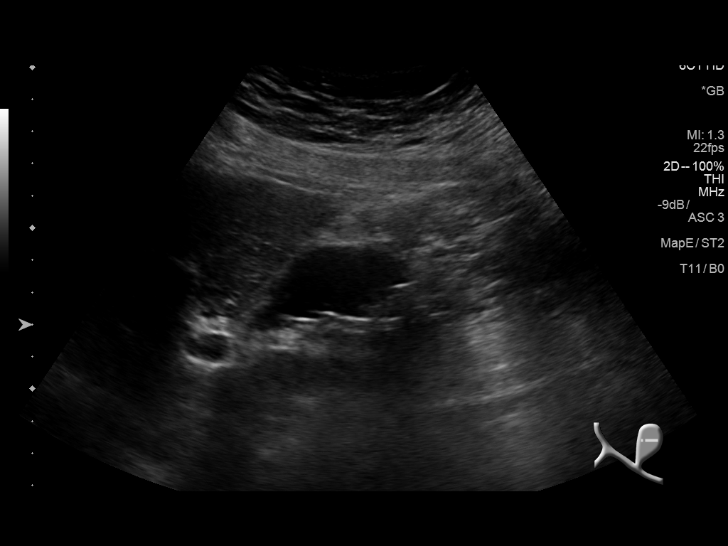
[im 13/49]
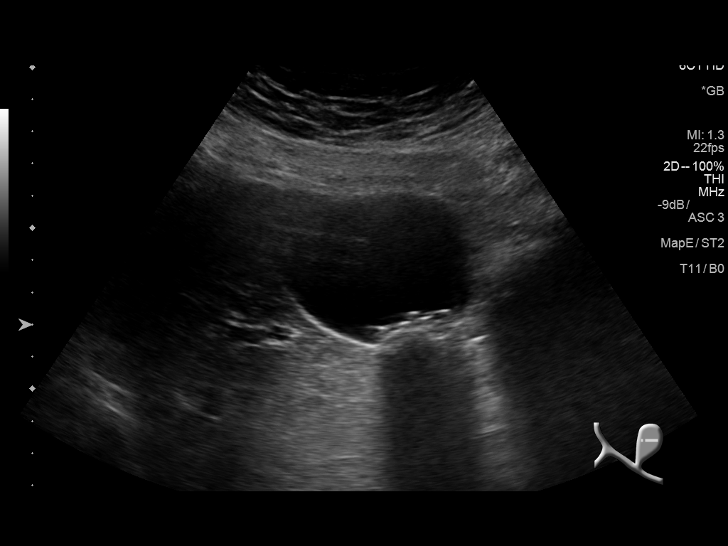
[im 17/49]
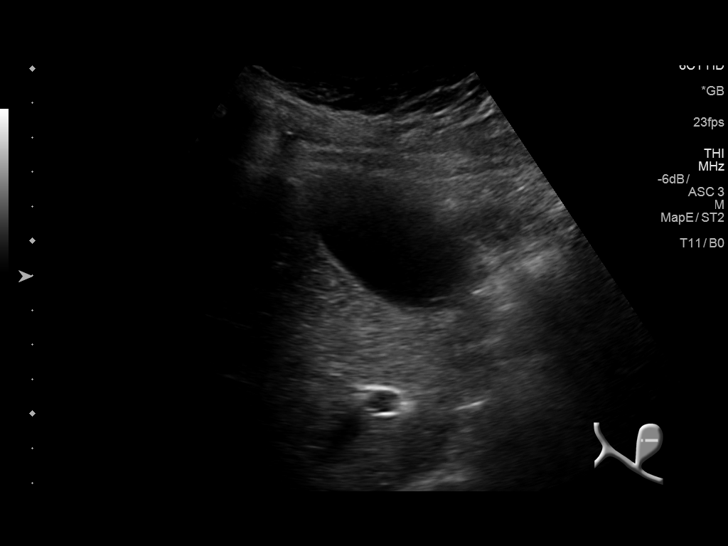
[im 19/49]
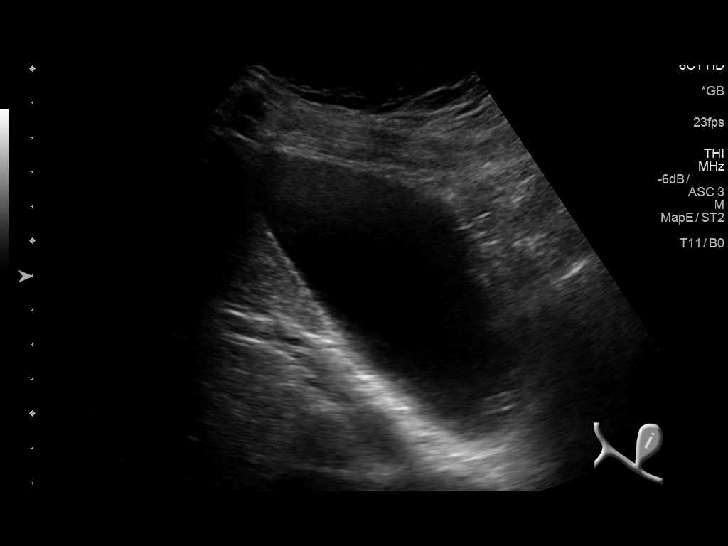
[im 23/49]
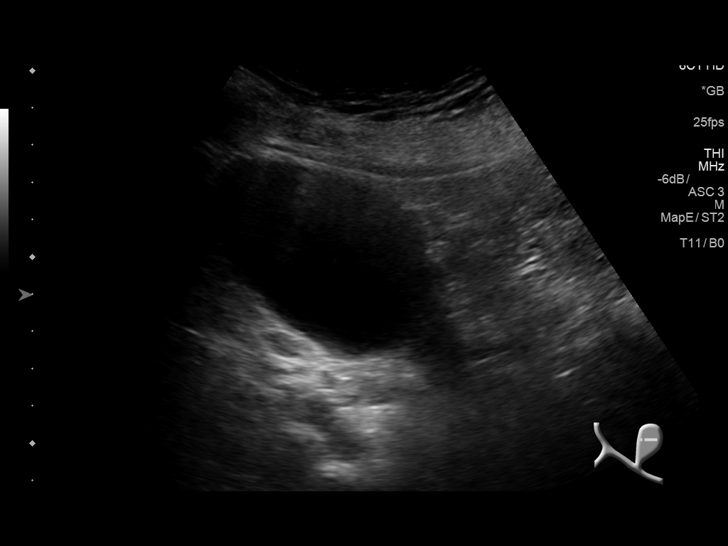
[im 27/49]
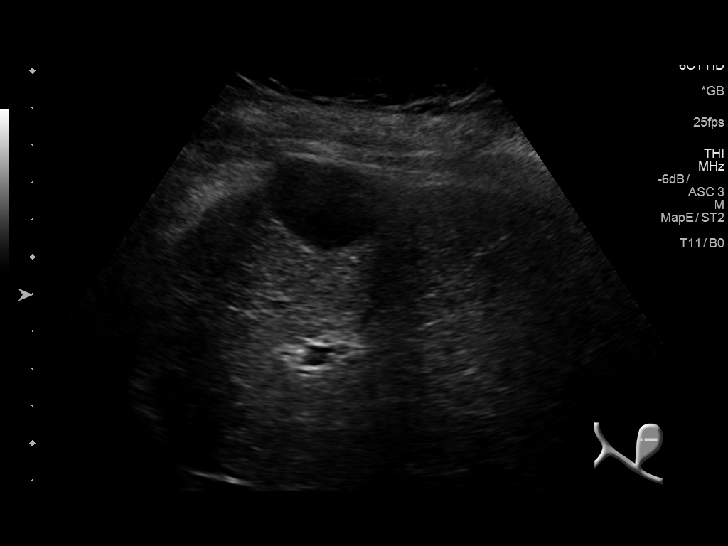
[im 31/49]
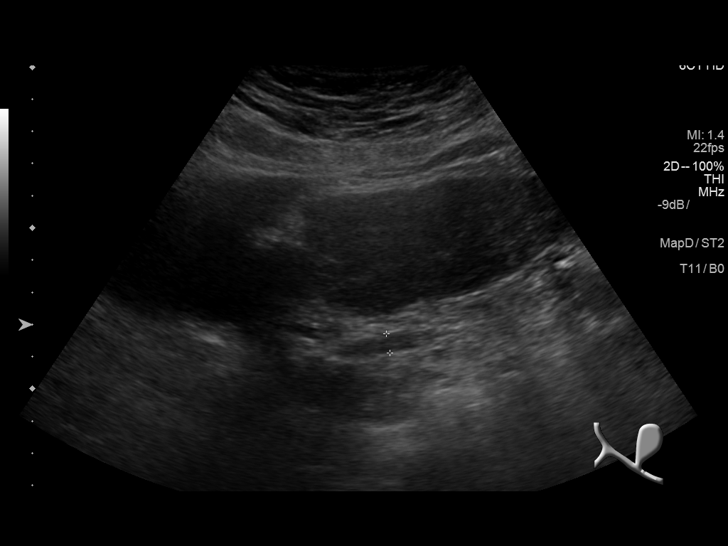
[im 33/49]
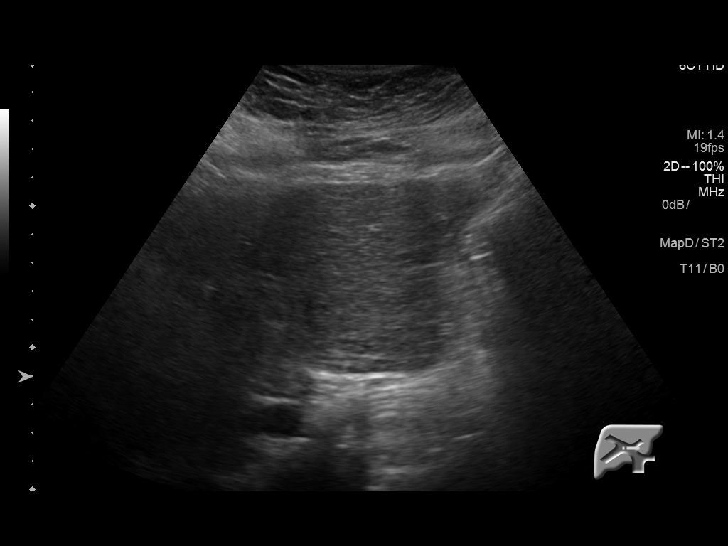
[im 37/49]
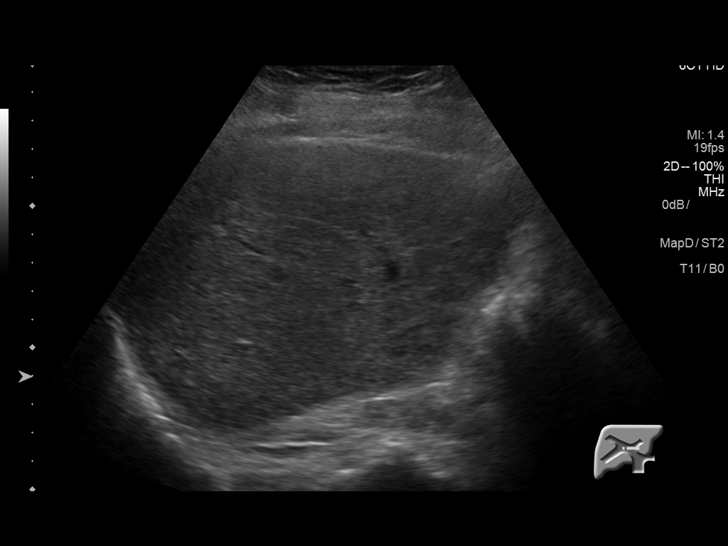
[im 41/49]
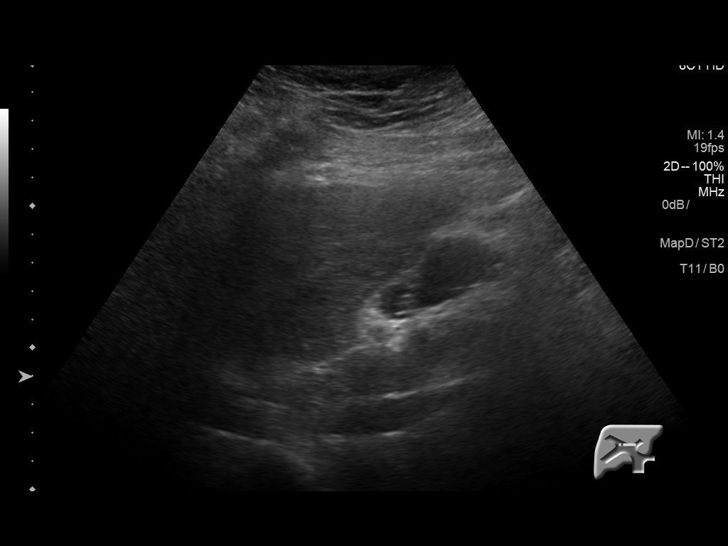
[im 45/49]
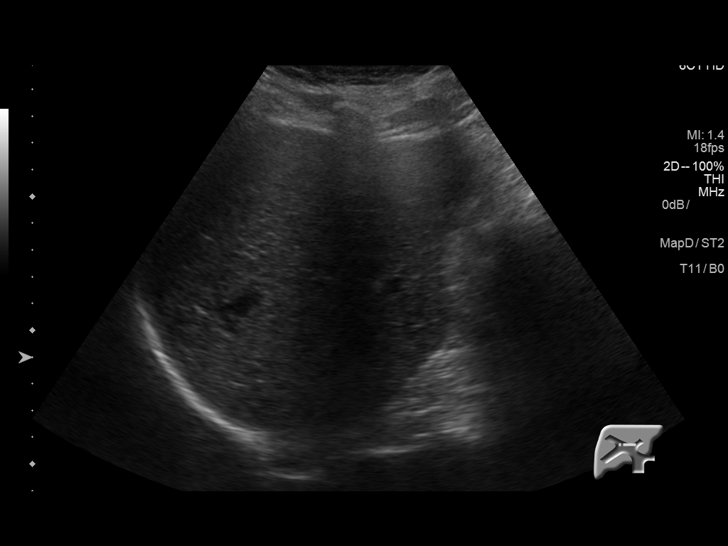
[im 49/49]
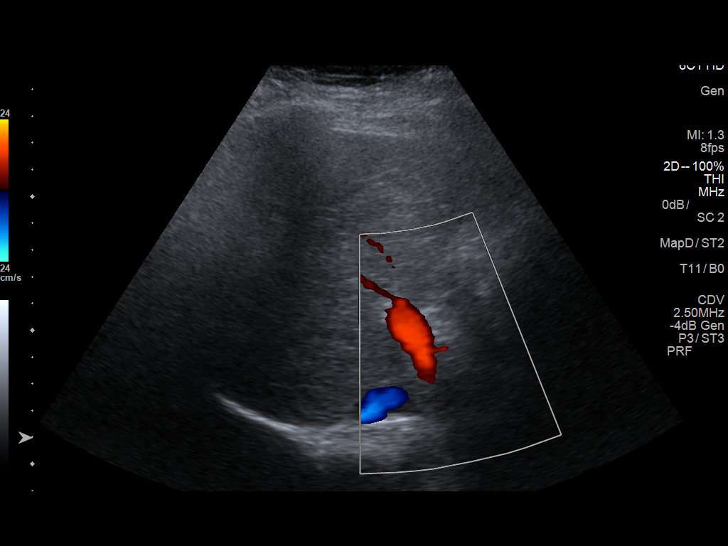

[14 of 25 positions shown; findings below may reference images not displayed]

FINDINGS: Gallbladder:

Cholelithiasis is noted with the largest gallstone measuring 1 cm.
No gallbladder wall thickening or pericholecystic fluid is noted. No
sonographic Murphy's sign is noted.

Common bile duct:

Diameter: 6 mm which is within normal limits.

Liver:

No focal lesion identified. Coarse echotexture is noted suggesting
diffuse hepatocellular disease.
IMPRESSION: Cholelithiasis without evidence of cholecystitis.

Coarse echotexture of hepatic parenchyma is noted suggesting
hepatocellular disease. No focal abnormality is noted.

## 2017-09-07 DIAGNOSIS — Z23 Encounter for immunization: Secondary | ICD-10-CM | POA: Diagnosis not present

## 2017-10-13 DIAGNOSIS — H43393 Other vitreous opacities, bilateral: Secondary | ICD-10-CM | POA: Diagnosis not present

## 2017-10-13 DIAGNOSIS — H26493 Other secondary cataract, bilateral: Secondary | ICD-10-CM | POA: Diagnosis not present

## 2017-10-13 DIAGNOSIS — Z961 Presence of intraocular lens: Secondary | ICD-10-CM | POA: Diagnosis not present

## 2017-10-13 DIAGNOSIS — H43813 Vitreous degeneration, bilateral: Secondary | ICD-10-CM | POA: Diagnosis not present

## 2018-01-09 DIAGNOSIS — Z961 Presence of intraocular lens: Secondary | ICD-10-CM | POA: Diagnosis not present

## 2018-01-09 DIAGNOSIS — H40003 Preglaucoma, unspecified, bilateral: Secondary | ICD-10-CM | POA: Diagnosis not present

## 2018-01-09 DIAGNOSIS — H26491 Other secondary cataract, right eye: Secondary | ICD-10-CM | POA: Diagnosis not present

## 2018-01-09 DIAGNOSIS — H43813 Vitreous degeneration, bilateral: Secondary | ICD-10-CM | POA: Diagnosis not present

## 2018-01-09 DIAGNOSIS — H524 Presbyopia: Secondary | ICD-10-CM | POA: Diagnosis not present

## 2018-01-09 DIAGNOSIS — H52223 Regular astigmatism, bilateral: Secondary | ICD-10-CM | POA: Diagnosis not present

## 2018-04-27 ENCOUNTER — Ambulatory Visit (INDEPENDENT_AMBULATORY_CARE_PROVIDER_SITE_OTHER): Payer: Medicare Other | Admitting: Internal Medicine

## 2018-05-02 ENCOUNTER — Other Ambulatory Visit: Payer: Self-pay

## 2018-05-02 DIAGNOSIS — I739 Peripheral vascular disease, unspecified: Secondary | ICD-10-CM

## 2018-05-15 ENCOUNTER — Telehealth (HOSPITAL_COMMUNITY): Payer: Self-pay

## 2018-05-15 NOTE — Telephone Encounter (Signed)
Left voicemail reminder for patient.

## 2018-05-17 ENCOUNTER — Other Ambulatory Visit: Payer: Self-pay

## 2018-05-17 ENCOUNTER — Ambulatory Visit: Payer: Medicare HMO | Admitting: Vascular Surgery

## 2018-05-17 ENCOUNTER — Other Ambulatory Visit: Payer: Self-pay | Admitting: *Deleted

## 2018-05-17 ENCOUNTER — Ambulatory Visit (HOSPITAL_COMMUNITY)
Admission: RE | Admit: 2018-05-17 | Discharge: 2018-05-17 | Disposition: A | Discharge status: Home / Self Care | Payer: Medicare HMO | Source: Ambulatory Visit | Attending: Vascular Surgery | Admitting: Vascular Surgery

## 2018-05-17 ENCOUNTER — Encounter: Payer: Self-pay | Admitting: *Deleted

## 2018-05-17 ENCOUNTER — Encounter: Payer: Self-pay | Admitting: Vascular Surgery

## 2018-05-17 VITALS — BP 136/81 | HR 75 | Temp 98.2°F | Resp 20 | Ht 76.0 in | Wt 235.9 lb

## 2018-05-17 DIAGNOSIS — I739 Peripheral vascular disease, unspecified: Secondary | ICD-10-CM

## 2018-05-17 NOTE — Progress Notes (Signed)
REASON FOR CONSULT:    Severe peripheral vascular disease.  The consult is requested by Dr. Woody Seller.  ASSESSMENT & PLAN:   SEVERE PERIPHERAL VASCULAR DISEASE: Based on his noninvasive studies and physical exam, the patient has evidence of multilevel arterial occlusive disease.  It is difficult to assess his symptoms as he has significant nerve damage in his lower extremities and has very poor feeling in his legs.  His ABI on the left is only 36% and I explained that certainly if he gets a wound on his foot this could potentially become a limb threatening problem.  I have recommended that we proceed with arteriography.  I have reviewed with the patient the indications for arteriography. In addition, I have reviewed the potential complications of arteriography including but not limited to: Bleeding, arterial injury, arterial thrombosis, dye action, renal insufficiency, or other unpredictable medical problems. I have explained to the patient that if we find disease amenable to angioplasty we could potentially address this at the same time. I have discussed the potential complications of angioplasty and stenting, including but not limited to: Bleeding, arterial thrombosis, arterial injury, dissection, or the need for surgical intervention.  I have explained that if we do place a stent we would likely put him on Plavix and a low-dose statin.  Deitra Mayo, MD, FACS Beeper [phone] Office: [phone]   HPI:   Jason Robinson is a pleasant 68 y.o. male, who is referred with severe peripheral vascular disease.  The patient does describe some claudication in both lower extremities more significantly on the left side.  His symptoms involve mostly his calf and thigh which is brought on by ambulation and relieved with rest.  He denies any rest pain but really has no sensation in his feet.  He states that he has a history of hepatitis C and that was told that his cirrhosis was responsible for his nerve  damage in his feet.  He has been treated for his hepatitis C.  He does describe pain in his calves at night when it is cold that is relieved somewhat by warming his legs.  He denies any history of nonhealing wounds.  His risk factors for peripheral vascular disease include hypertension and tobacco use.  He quit smoking 15 months ago.  He was a 1 pack/day smoker.  He denies any history of diabetes, hypercholesterolemia, or family history of premature cardiovascular disease.  Past Medical History:  Diagnosis Date  . Arthritis   . Cancer ([place])   . GERD (gastroesophageal reflux disease)   . Headache   . Heart murmur   . Hepatitis   . Hepatitis C reactive   . Hypertension   . Neuromuscular disorder ([place])    Necropathy bilateral lower legs  . Neuropathy    ? etiology  . Neuropathy    Bilateral lower legs    History reviewed. No pertinent family history.  SOCIAL HISTORY: Social History   Socioeconomic History  . Marital status: Widowed    Spouse name: Not on file  . Number of children: 1  . Years of education: AA [place]  . Highest education level: Not on file  Occupational History  . Occupation: Retired   Scientific laboratory technician  . Financial resource strain: Not on file  . Food insecurity:    Worry: Not on file    Inability: Not on file  . Transportation needs:    Medical: Not on file    Non-medical: Not on file  Tobacco Use  .  Smoking status: Former Smoker    Packs/day: 1.00    Years: 14.00    Pack years: 14.00    Types: Cigarettes    Last attempt to quit: 02/04/2017    Years since quitting: 1.2  . Smokeless tobacco: Never Used  . Tobacco comment: 1 pack a day sine age 74.;  Substance and Sexual Activity  . Alcohol use: No  . Drug use: Yes    Types: Amphetamines    Comment: Not since the 1970,s  . Sexual activity: Not on file  Lifestyle  . Physical activity:    Days per week: Not on file    Minutes per session: Not on file  . Stress: Not on file  Relationships  . Social  connections:    Talks on phone: Not on file    Gets together: Not on file    Attends religious service: Not on file    Active member of club or organization: Not on file    Attends meetings of clubs or organizations: Not on file    Relationship status: Not on file  . Intimate partner violence:    Fear of current or ex partner: Not on file    Emotionally abused: Not on file    Physically abused: Not on file    Forced sexual activity: Not on file  Other Topics Concern  . Not on file  Social History Narrative   Lives with mom   Caffeine use: Coffee/soda daily   Tea occass    Allergies  Allergen Reactions  . Lyrica [Pregabalin] Swelling    Swelling to feet  . Penicillins Other (See Comments)    Patient has never taken this medicine.  Had an allergy test done and was told he was allergic to penicillin. Has patient had a PCN reaction causing immediate rash, facial/tongue/throat swelling, SOB or lightheadedness with hypotension: No Has patient had a PCN reaction causing severe rash involving mucus membranes or skin necrosis: No Has patient had a PCN reaction that required hospitalization: No Has patient had a PCN reaction occurring within the last 10 years: No If all of the above answers are  . Kwell [Lindane] Rash    Current Outpatient Medications  Medication Sig Dispense Refill  . amLODipine-benazepril (LOTREL) 10-40 MG per capsule Take 1 capsule by mouth daily.    Marland Kitchen aspirin EC 81 MG tablet Take 81 mg by mouth daily.    . cetirizine (ZYRTEC) 10 MG tablet Take 10 mg by mouth daily.     Marland Kitchen docusate sodium (COLACE) 100 MG capsule Take 100 mg by mouth daily.    . fluticasone (FLONASE) 50 MCG/ACT nasal spray Place 2 sprays into both nostrils daily.     . Ginkgo Biloba 120 MG CAPS Take 120 mg by mouth daily.     Marland Kitchen KRILL OIL PO Take 1 capsule by mouth daily.     . milk thistle 175 MG tablet Take 175 mg by mouth daily.    . Multiple Vitamin (MULTIVITAMIN) tablet Take 1 tablet by mouth  daily.    . traMADol (ULTRAM) 50 MG tablet Take 50 mg by mouth every 6 (six) hours as needed for moderate pain.     . vitamin C (ASCORBIC ACID) 500 MG tablet Take 500 mg by mouth daily.    . rizatriptan (MAXALT-MLT) 10 MG disintegrating tablet Take 10 mg by mouth every 2 (two) hours as needed for migraine. May repeat in 2 hours if needed     No current facility-administered  medications for this visit.     REVIEW OF SYSTEMS:  [X]  denotes positive finding, [ ]  denotes negative finding Cardiac  Comments:  Chest pain or chest pressure:    Shortness of breath upon exertion:    Short of breath when lying flat:    Irregular heart rhythm:        Vascular    Pain in calf, thigh, or hip brought on by ambulation:    Pain in feet at night that wakes you up from your sleep:  x cramps  Blood clot in your veins:    Leg swelling:         Pulmonary    Oxygen at home:    Productive cough:  x   Wheezing:         Neurologic    Sudden weakness in arms or legs:     Sudden numbness in arms or legs:     Sudden onset of difficulty speaking or slurred speech:    Temporary loss of vision in one eye:     Problems with dizziness:  x       Gastrointestinal    Blood in stool:     Vomited blood:         Genitourinary    Burning when urinating:     Blood in urine:        Psychiatric    Major depression:         Hematologic    Bleeding problems:    Problems with blood clotting too easily:        Skin    Rashes or ulcers:        Constitutional    Fever or chills:     PHYSICAL EXAM:   Vitals:   05/17/18 1007  BP: 136/81  Pulse: 75  Resp: 20  Temp: 98.2 F (36.8 C)  SpO2: 97%  Weight: 235 lb 14.4 oz (107 kg)  Height: 6\' 4"  (1.93 m)    GENERAL: The patient is a well-nourished male, in no acute distress. The vital signs are documented above. CARDIAC: There is a regular rate and rhythm.  VASCULAR: I do not detect carotid bruits. On the right side he has a diminished right femoral  pulse.  I cannot palpate a popliteal or pedal pulses. On the left side, I cannot palpate a femoral pulse.  I cannot palpate popliteal or pedal pulses. Both feet appear adequately perfused. He has no significant lower extremity swelling. PULMONARY: There is good air exchange bilaterally without wheezing or rales. ABDOMEN: Soft and non-tender with normal pitched bowel sounds.  I do not palpate an abdominal aortic aneurysm. MUSCULOSKELETAL: There are no major deformities or cyanosis. NEUROLOGIC: No focal weakness or paresthesias are detected. SKIN: There are no ulcers or rashes noted. PSYCHIATRIC: The patient has a normal affect.  DATA:    ARTERIAL DOPPLER STUDY: I have independently interpreted his arterial Doppler study today.  On the right side, there is a monophasic dorsalis pedis and posterior tibial signal.  ABI is 48%.  Toe pressure is 51 mmHg.  I reviewed his labs from 2018.  At that time he had normal renal function with a GFR greater than 60.

## 2018-05-17 NOTE — H&P (View-Only) (Signed)
REASON FOR CONSULT:    Severe peripheral vascular disease.  The consult is requested by Dr. Woody Seller.  ASSESSMENT & PLAN:   SEVERE PERIPHERAL VASCULAR DISEASE: Based on his noninvasive studies and physical exam, the patient has evidence of multilevel arterial occlusive disease.  It is difficult to assess his symptoms as he has significant nerve damage in his lower extremities and has very poor feeling in his legs.  His ABI on the left is only 36% and I explained that certainly if he gets a wound on his foot this could potentially become a limb threatening problem.  I have recommended that we proceed with arteriography.  I have reviewed with the patient the indications for arteriography. In addition, I have reviewed the potential complications of arteriography including but not limited to: Bleeding, arterial injury, arterial thrombosis, dye action, renal insufficiency, or other unpredictable medical problems. I have explained to the patient that if we find disease amenable to angioplasty we could potentially address this at the same time. I have discussed the potential complications of angioplasty and stenting, including but not limited to: Bleeding, arterial thrombosis, arterial injury, dissection, or the need for surgical intervention.  I have explained that if we do place a stent we would likely put him on Plavix and a low-dose statin.  Deitra Mayo, MD, FACS Beeper [phone] Office: [phone]   HPI:   Jason Robinson is a pleasant 68 y.o. male, who is referred with severe peripheral vascular disease.  The patient does describe some claudication in both lower extremities more significantly on the left side.  His symptoms involve mostly his calf and thigh which is brought on by ambulation and relieved with rest.  He denies any rest pain but really has no sensation in his feet.  He states that he has a history of hepatitis C and that was told that his cirrhosis was responsible for his nerve  damage in his feet.  He has been treated for his hepatitis C.  He does describe pain in his calves at night when it is cold that is relieved somewhat by warming his legs.  He denies any history of nonhealing wounds.  His risk factors for peripheral vascular disease include hypertension and tobacco use.  He quit smoking 15 months ago.  He was a 1 pack/day smoker.  He denies any history of diabetes, hypercholesterolemia, or family history of premature cardiovascular disease.  Past Medical History:  Diagnosis Date  . Arthritis   . Cancer ([place])   . GERD (gastroesophageal reflux disease)   . Headache   . Heart murmur   . Hepatitis   . Hepatitis C reactive   . Hypertension   . Neuromuscular disorder ([place])    Necropathy bilateral lower legs  . Neuropathy    ? etiology  . Neuropathy    Bilateral lower legs    History reviewed. No pertinent family history.  SOCIAL HISTORY: Social History   Socioeconomic History  . Marital status: Widowed    Spouse name: Not on file  . Number of children: 1  . Years of education: AA [place]  . Highest education level: Not on file  Occupational History  . Occupation: Retired   Scientific laboratory technician  . Financial resource strain: Not on file  . Food insecurity:    Worry: Not on file    Inability: Not on file  . Transportation needs:    Medical: Not on file    Non-medical: Not on file  Tobacco Use  .  Smoking status: Former Smoker    Packs/day: 1.00    Years: 14.00    Pack years: 14.00    Types: Cigarettes    Last attempt to quit: 02/04/2017    Years since quitting: 1.2  . Smokeless tobacco: Never Used  . Tobacco comment: 1 pack a day sine age 74.;  Substance and Sexual Activity  . Alcohol use: No  . Drug use: Yes    Types: Amphetamines    Comment: Not since the 1970,s  . Sexual activity: Not on file  Lifestyle  . Physical activity:    Days per week: Not on file    Minutes per session: Not on file  . Stress: Not on file  Relationships  . Social  connections:    Talks on phone: Not on file    Gets together: Not on file    Attends religious service: Not on file    Active member of club or organization: Not on file    Attends meetings of clubs or organizations: Not on file    Relationship status: Not on file  . Intimate partner violence:    Fear of current or ex partner: Not on file    Emotionally abused: Not on file    Physically abused: Not on file    Forced sexual activity: Not on file  Other Topics Concern  . Not on file  Social History Narrative   Lives with mom   Caffeine use: Coffee/soda daily   Tea occass    Allergies  Allergen Reactions  . Lyrica [Pregabalin] Swelling    Swelling to feet  . Penicillins Other (See Comments)    Patient has never taken this medicine.  Had an allergy test done and was told he was allergic to penicillin. Has patient had a PCN reaction causing immediate rash, facial/tongue/throat swelling, SOB or lightheadedness with hypotension: No Has patient had a PCN reaction causing severe rash involving mucus membranes or skin necrosis: No Has patient had a PCN reaction that required hospitalization: No Has patient had a PCN reaction occurring within the last 10 years: No If all of the above answers are  . Kwell [[place]] Rash    Current Outpatient Medications  Medication Sig Dispense Refill  . amLODipine-benazepril (LOTREL) 10-40 MG per capsule Take 1 capsule by mouth daily.    Marland Kitchen aspirin EC 81 MG tablet Take 81 mg by mouth daily.    . cetirizine (ZYRTEC) 10 MG tablet Take 10 mg by mouth daily.     Marland Kitchen docusate sodium (COLACE) 100 MG capsule Take 100 mg by mouth daily.    . fluticasone (FLONASE) 50 MCG/ACT nasal spray Place 2 sprays into both nostrils daily.     . Ginkgo Biloba 120 MG CAPS Take 120 mg by mouth daily.     Marland Kitchen KRILL OIL PO Take 1 capsule by mouth daily.     . milk thistle 175 MG tablet Take 175 mg by mouth daily.    . Multiple Vitamin (MULTIVITAMIN) tablet Take 1 tablet by mouth  daily.    . traMADol (ULTRAM) 50 MG tablet Take 50 mg by mouth every 6 (six) hours as needed for moderate pain.     . vitamin C (ASCORBIC ACID) 500 MG tablet Take 500 mg by mouth daily.    . rizatriptan (MAXALT-MLT) 10 MG disintegrating tablet Take 10 mg by mouth every 2 (two) hours as needed for migraine. May repeat in 2 hours if needed     No current facility-administered  medications for this visit.     REVIEW OF SYSTEMS:  [X]  denotes positive finding, [ ]  denotes negative finding Cardiac  Comments:  Chest pain or chest pressure:    Shortness of breath upon exertion:    Short of breath when lying flat:    Irregular heart rhythm:        Vascular    Pain in calf, thigh, or hip brought on by ambulation:    Pain in feet at night that wakes you up from your sleep:  x cramps  Blood clot in your veins:    Leg swelling:         Pulmonary    Oxygen at home:    Productive cough:  x   Wheezing:         Neurologic    Sudden weakness in arms or legs:     Sudden numbness in arms or legs:     Sudden onset of difficulty speaking or slurred speech:    Temporary loss of vision in one eye:     Problems with dizziness:  x       Gastrointestinal    Blood in stool:     Vomited blood:         Genitourinary    Burning when urinating:     Blood in urine:        Psychiatric    Major depression:         Hematologic    Bleeding problems:    Problems with blood clotting too easily:        Skin    Rashes or ulcers:        Constitutional    Fever or chills:     PHYSICAL EXAM:   Vitals:   05/17/18 1007  BP: 136/81  Pulse: 75  Resp: 20  Temp: 98.2 F (36.8 C)  SpO2: 97%  Weight: 235 lb 14.4 oz (107 kg)  Height: 6\' 4"  (1.93 m)    GENERAL: The patient is a well-nourished male, in no acute distress. The vital signs are documented above. CARDIAC: There is a regular rate and rhythm.  VASCULAR: I do not detect carotid bruits. On the right side he has a diminished right femoral  pulse.  I cannot palpate a popliteal or pedal pulses. On the left side, I cannot palpate a femoral pulse.  I cannot palpate popliteal or pedal pulses. Both feet appear adequately perfused. He has no significant lower extremity swelling. PULMONARY: There is good air exchange bilaterally without wheezing or rales. ABDOMEN: Soft and non-tender with normal pitched bowel sounds.  I do not palpate an abdominal aortic aneurysm. MUSCULOSKELETAL: There are no major deformities or cyanosis. NEUROLOGIC: No focal weakness or paresthesias are detected. SKIN: There are no ulcers or rashes noted. PSYCHIATRIC: The patient has a normal affect.  DATA:    ARTERIAL DOPPLER STUDY: I have independently interpreted his arterial Doppler study today.  On the right side, there is a monophasic dorsalis pedis and posterior tibial signal.  ABI is 48%.  Toe pressure is 51 mmHg.  I reviewed his labs from 2018.  At that time he had normal renal function with a GFR greater than 60.

## 2018-05-18 ENCOUNTER — Other Ambulatory Visit: Payer: Self-pay

## 2018-05-18 ENCOUNTER — Encounter (HOSPITAL_COMMUNITY)
Admission: RE | Disposition: A | Discharge status: Home / Self Care | Payer: Self-pay | Source: Home / Self Care | Attending: Vascular Surgery

## 2018-05-18 ENCOUNTER — Encounter (HOSPITAL_COMMUNITY): Payer: Self-pay | Admitting: Vascular Surgery

## 2018-05-18 ENCOUNTER — Ambulatory Visit (HOSPITAL_COMMUNITY)
Admission: RE | Admit: 2018-05-18 | Discharge: 2018-05-18 | Disposition: A | Discharge status: Home / Self Care | Payer: Medicare HMO | Attending: Vascular Surgery | Admitting: Vascular Surgery

## 2018-05-18 DIAGNOSIS — M1990 Unspecified osteoarthritis, unspecified site: Secondary | ICD-10-CM | POA: Diagnosis not present

## 2018-05-18 DIAGNOSIS — G629 Polyneuropathy, unspecified: Secondary | ICD-10-CM | POA: Diagnosis not present

## 2018-05-18 DIAGNOSIS — I10 Essential (primary) hypertension: Secondary | ICD-10-CM | POA: Diagnosis not present

## 2018-05-18 DIAGNOSIS — I70213 Atherosclerosis of native arteries of extremities with intermittent claudication, bilateral legs: Secondary | ICD-10-CM | POA: Insufficient documentation

## 2018-05-18 DIAGNOSIS — I701 Atherosclerosis of renal artery: Secondary | ICD-10-CM | POA: Diagnosis not present

## 2018-05-18 DIAGNOSIS — Z1159 Encounter for screening for other viral diseases: Secondary | ICD-10-CM | POA: Diagnosis not present

## 2018-05-18 DIAGNOSIS — I739 Peripheral vascular disease, unspecified: Secondary | ICD-10-CM

## 2018-05-18 DIAGNOSIS — Z880 Allergy status to penicillin: Secondary | ICD-10-CM | POA: Diagnosis not present

## 2018-05-18 DIAGNOSIS — Z79899 Other long term (current) drug therapy: Secondary | ICD-10-CM | POA: Diagnosis not present

## 2018-05-18 DIAGNOSIS — Z7982 Long term (current) use of aspirin: Secondary | ICD-10-CM | POA: Diagnosis not present

## 2018-05-18 DIAGNOSIS — Z888 Allergy status to other drugs, medicaments and biological substances status: Secondary | ICD-10-CM | POA: Diagnosis not present

## 2018-05-18 DIAGNOSIS — Z87891 Personal history of nicotine dependence: Secondary | ICD-10-CM | POA: Diagnosis not present

## 2018-05-18 DIAGNOSIS — B1920 Unspecified viral hepatitis C without hepatic coma: Secondary | ICD-10-CM | POA: Insufficient documentation

## 2018-05-18 DIAGNOSIS — K7460 Unspecified cirrhosis of liver: Secondary | ICD-10-CM | POA: Diagnosis not present

## 2018-05-18 DIAGNOSIS — K219 Gastro-esophageal reflux disease without esophagitis: Secondary | ICD-10-CM | POA: Diagnosis not present

## 2018-05-18 HISTORY — PX: ABDOMINAL AORTOGRAM W/LOWER EXTREMITY: CATH118223

## 2018-05-18 LAB — POCT I-STAT, CHEM 8
BUN: 22 mg/dL (ref 8–23)
Calcium, Ion: 1.12 mmol/L — ABNORMAL LOW (ref 1.15–1.40)
Chloride: 109 mmol/L (ref 98–111)
Creatinine, Ser: 1.1 mg/dL (ref 0.61–1.24)
Glucose, Bld: 106 mg/dL — ABNORMAL HIGH (ref 70–99)
HCT: 45 % (ref 39.0–52.0)
Hemoglobin: 15.3 g/dL (ref 13.0–17.0)
Potassium: 4 mmol/L (ref 3.5–5.1)
Sodium: 142 mmol/L (ref 135–145)
TCO2: 22 mmol/L (ref 22–32)

## 2018-05-18 LAB — SARS CORONAVIRUS 2 BY RT PCR (HOSPITAL ORDER, PERFORMED IN ~~LOC~~ HOSPITAL LAB): SARS Coronavirus 2: NEGATIVE

## 2018-05-18 SURGERY — ABDOMINAL AORTOGRAM W/LOWER EXTREMITY
Anesthesia: LOCAL | Laterality: Bilateral

## 2018-05-18 MED ORDER — MIDAZOLAM HCL 2 MG/2ML IJ SOLN
INTRAMUSCULAR | Status: DC | PRN
  Administered 2018-05-18: 1 mg via INTRAVENOUS

## 2018-05-18 MED ORDER — HYDRALAZINE HCL 20 MG/ML IJ SOLN: 5.0000 mg | INTRAMUSCULAR | Status: DC | PRN

## 2018-05-18 MED ORDER — SODIUM CHLORIDE 0.9 % WEIGHT BASED INFUSION: 1.0000 mL/kg/h | INTRAVENOUS | Status: DC

## 2018-05-18 MED ORDER — ACETAMINOPHEN 325 MG PO TABS: 650.0000 mg | ORAL_TABLET | ORAL | Status: DC | PRN

## 2018-05-18 MED ORDER — ROSUVASTATIN CALCIUM 10 MG PO TABS: 10.0000 mg | ORAL_TABLET | Freq: Every day | ORAL | Status: DC

## 2018-05-18 MED ORDER — LIDOCAINE HCL (PF) 1 % IJ SOLN
INTRAMUSCULAR | Status: DC | PRN
  Administered 2018-05-18: 18 mL via INTRADERMAL

## 2018-05-18 MED ORDER — ONDANSETRON HCL 4 MG/2ML IJ SOLN: 4.0000 mg | Freq: Four times a day (QID) | INTRAMUSCULAR | Status: DC | PRN

## 2018-05-18 MED ORDER — MIDAZOLAM HCL 2 MG/2ML IJ SOLN
INTRAMUSCULAR | Status: AC
  Filled 2018-05-18: qty 2

## 2018-05-18 MED ORDER — FENTANYL CITRATE (PF) 100 MCG/2ML IJ SOLN
INTRAMUSCULAR | Status: DC | PRN
  Administered 2018-05-18: 50 ug via INTRAVENOUS

## 2018-05-18 MED ORDER — HEPARIN (PORCINE) IN NACL 1000-0.9 UT/500ML-% IV SOLN
INTRAVENOUS | Status: DC | PRN
  Administered 2018-05-18 (×2): 500 mL

## 2018-05-18 MED ORDER — SODIUM CHLORIDE 0.9 % IV SOLN
INTRAVENOUS | Status: DC
  Administered 2018-05-18: 07:00:00 via INTRAVENOUS

## 2018-05-18 MED ORDER — LABETALOL HCL 5 MG/ML IV SOLN: 10.0000 mg | INTRAVENOUS | Status: DC | PRN

## 2018-05-18 MED ORDER — SODIUM CHLORIDE 0.9 % IV SOLN: 250.0000 mL | INTRAVENOUS | Status: DC | PRN

## 2018-05-18 MED ORDER — HEPARIN (PORCINE) IN NACL 1000-0.9 UT/500ML-% IV SOLN
INTRAVENOUS | Status: AC
  Filled 2018-05-18: qty 1000

## 2018-05-18 MED ORDER — LIDOCAINE HCL (PF) 1 % IJ SOLN
INTRAMUSCULAR | Status: AC
  Filled 2018-05-18: qty 30

## 2018-05-18 MED ORDER — FENTANYL CITRATE (PF) 100 MCG/2ML IJ SOLN
INTRAMUSCULAR | Status: AC
  Filled 2018-05-18: qty 2

## 2018-05-18 MED ORDER — SODIUM CHLORIDE 0.9% FLUSH: 3.0000 mL | INTRAVENOUS | Status: DC | PRN

## 2018-05-18 MED ORDER — ROSUVASTATIN CALCIUM 10 MG PO TABS: 10.0000 mg | ORAL_TABLET | Freq: Every day | ORAL | 11 refills | Status: AC

## 2018-05-18 MED ORDER — SODIUM CHLORIDE 0.9% FLUSH: 3.0000 mL | Freq: Two times a day (BID) | INTRAVENOUS | Status: DC

## 2018-05-18 MED ORDER — IODIXANOL 320 MG/ML IV SOLN
INTRAVENOUS | Status: DC | PRN
  Administered 2018-05-18: 10:00:00 167 mL via INTRA_ARTERIAL

## 2018-05-18 SURGICAL SUPPLY — 10 items
CATH ANGIO 5F PIGTAIL 100CM (CATHETERS) ×2 IMPLANT
CATH ANGIO 5F PIGTAIL 65CM (CATHETERS) ×2 IMPLANT
KIT MICROPUNCTURE NIT STIFF (SHEATH) ×2 IMPLANT
KIT PV (KITS) ×2 IMPLANT
SHEATH PINNACLE 5F 10CM (SHEATH) ×2 IMPLANT
SHEATH PROBE COVER 6X72 (BAG) ×2 IMPLANT
SYR MEDRAD MARK V 150ML (SYRINGE) ×2 IMPLANT
TRANSDUCER W/STOPCOCK (MISCELLANEOUS) ×2 IMPLANT
TRAY PV CATH (CUSTOM PROCEDURE TRAY) ×2 IMPLANT
WIRE HITORQ VERSACORE ST 145CM (WIRE) ×2 IMPLANT

## 2018-05-18 NOTE — Discharge Instructions (Signed)
Angiogram, Care After °This sheet gives you information about how to care for yourself after your procedure. Your health care provider may also give you more specific instructions. If you have problems or questions, contact your health care provider. °What can I expect after the procedure? °After the procedure, it is common to have bruising and tenderness at the catheter insertion area. °Follow these instructions at home: °Insertion site care °· Follow instructions from your health care provider about how to take care of your insertion site. Make sure you: °? Wash your hands with soap and water before you change your bandage (dressing). If soap and water are not available, use hand sanitizer. °? Change your dressing as told by your health care provider. °? Leave stitches (sutures), skin glue, or adhesive strips in place. These skin closures may need to stay in place for 2 weeks or longer. If adhesive strip edges start to loosen and curl up, you may trim the loose edges. Do not remove adhesive strips completely unless your health care provider tells you to do that. °· Do not take baths, swim, or use a hot tub until your health care provider approves. °· You may shower 24-48 hours after the procedure or as told by your health care provider. °? Gently wash the site with plain soap and water. °? Pat the area dry with a clean towel. °? Do not rub the site. This may cause bleeding. °· Do not apply powder or lotion to the site. Keep the site clean and dry. °· Check your insertion site every day for signs of infection. Check for: °? Redness, swelling, or pain. °? Fluid or blood. °? Warmth. °? Pus or a bad smell. °Activity °· Rest as told by your health care provider, usually for 1-2 days. °· Do not lift anything that is heavier than 10 lbs. (4.5 kg) or as told by your health care provider. °· Do not drive for 24 hours if you were given a medicine to help you relax (sedative). °· Do not drive or use heavy machinery while  taking prescription pain medicine. °General instructions ° °· Return to your normal activities as told by your health care provider, usually in about a week. Ask your health care provider what activities are safe for you. °· If the catheter site starts bleeding, lie flat and put pressure on the site. If the bleeding does not stop, get help right away. This is a medical emergency. °· Drink enough fluid to keep your urine clear or pale yellow. This helps flush the contrast dye from your body. °· Take over-the-counter and prescription medicines only as told by your health care provider. °· Keep all follow-up visits as told by your health care provider. This is important. °Contact a health care provider if: °· You have a fever or chills. °· You have redness, swelling, or pain around your insertion site. °· You have fluid or blood coming from your insertion site. °· The insertion site feels warm to the touch. °· You have pus or a bad smell coming from your insertion site. °· You have bruising around the insertion site. °· You notice blood collecting in the tissue around the catheter site (hematoma). The hematoma may be painful to the touch. °Get help right away if: °· You have severe pain at the catheter insertion area. °· The catheter insertion area swells very fast. °· The catheter insertion area is bleeding, and the bleeding does not stop when you hold steady pressure on the area. °·   The area near or just beyond the catheter insertion site becomes pale, cool, tingly, or numb. These symptoms may represent a serious problem that is an emergency. Do not wait to see if the symptoms will go away. Get medical help right away. Call your local emergency services (911 in the [place] Do not drive yourself to the hospital. Summary  After the procedure, it is common to have bruising and tenderness at the catheter insertion area.  After the procedure, it is important to rest and drink plenty of fluids.  Do not take baths,  swim, or use a hot tub until your health care provider says it is okay to do so. You may shower 24-48 hours after the procedure or as told by your health care provider.  If the catheter site starts bleeding, lie flat and put pressure on the site. If the bleeding does not stop, get help right away. This is a medical emergency. This information is not intended to replace advice given to you by your health care provider. Make sure you discuss any questions you have with your health care provider. Document Released: 07/09/2004 Document Revised: 11/26/2015 Document Reviewed: 11/26/2015 Elsevier Interactive Patient Education  2019 [employer] Rosuvastatin Tablets What is this medicine? ROSUVASTATIN (roe SOO va sta tin) is known as a HMG-CoA reductase inhibitor or 'statin'. It lowers cholesterol and triglycerides in the blood. This drug may also reduce the risk of heart attack, stroke, or other health problems in patients with risk factors for heart disease. Diet and lifestyle changes are often used with this drug. This medicine may be used for other purposes; ask your health care provider or pharmacist if you have questions. COMMON BRAND NAME(S): Crestor What should I tell my health care provider before I take this medicine? They need to know if you have any of these conditions: -diabetes -if you often drink alcohol -history of stroke -kidney disease -liver disease -muscle aches or weakness -thyroid disease -an unusual or allergic reaction to rosuvastatin, other medicines, foods, dyes, or preservatives -pregnant or trying to get pregnant -breast-feeding How should I use this medicine? Take this medicine by mouth with a glass of water. Follow the directions on the prescription label. Do not cut, crush or chew this medicine. You can take this medicine with or without food. Take your doses at regular intervals. Do not take your medicine more often than directed. Talk to your pediatrician regarding  the use of this medicine in children. While this drug may be prescribed for children as young as 37 years old for selected conditions, precautions do apply. Overdosage: If you think you have taken too much of this medicine contact a poison control center or emergency room at once. NOTE: This medicine is only for you. Do not share this medicine with others. What if I miss a dose? If you miss a dose, take it as soon as you can. If your next dose is to be taken in less than 12 hours, then do not take the missed dose. Take the next dose at your regular time. Do not take double or extra doses. What may interact with this medicine? Do not take this medicine with any of the following medications: -herbal medicines like red yeast rice This medicine may also interact with the following medications: -alcohol -antacids containing aluminum hydroxide or magnesium hydroxide -cyclosporine -other medicines for high cholesterol -some medicines for HIV infection -warfarin This list may not describe all possible interactions. Give your health care provider a list of  all the medicines, herbs, non-prescription drugs, or dietary supplements you use. Also tell them if you smoke, drink alcohol, or use illegal drugs. Some items may interact with your medicine. What should I watch for while using this medicine? Visit your doctor or health care professional for regular check-ups. You may need regular tests to make sure your liver is working properly. Your health care professional may tell you to stop taking this medicine if you develop muscle problems. If your muscle problems do not go away after stopping this medicine, contact your health care professional. Do not become pregnant while taking this medicine. Women should inform their health care professional if they wish to become pregnant or think they might be pregnant. There is a potential for serious side effects to an unborn child. Talk to your health care professional  or pharmacist for more information. Do not breast-feed an infant while taking this medicine. This medicine may affect blood sugar levels. If you have diabetes, check with your doctor or health care professional before you change your diet or the dose of your diabetic medicine. If you are going to need surgery or other procedure, tell your doctor that you are using this medicine. This drug is only part of a total heart-health program. Your doctor or a dietician can suggest a low-cholesterol and low-fat diet to help. Avoid alcohol and smoking, and keep a proper exercise schedule. This medicine may cause a decrease in Co-Enzyme Q-10. You should make sure that you get enough Co-Enzyme Q-10 while you are taking this medicine. Discuss the foods you eat and the vitamins you take with your health care professional. What side effects may I notice from receiving this medicine? Side effects that you should report to your doctor or health care professional as soon as possible: -allergic reactions like skin rash, itching or hives, swelling of the face, lips, or tongue -dark urine -fever -joint pain -muscle cramps, pain -redness, blistering, peeling or loosening of the skin, including inside the mouth -trouble passing urine or change in the amount of urine -unusually weak or tired -yellowing of the eyes or skin Side effects that usually do not require medical attention (report to your doctor or health care professional if they continue or are bothersome): -constipation -heartburn -nausea -stomach gas, pain, upset This list may not describe all possible side effects. Call your doctor for medical advice about side effects. You may report side effects to FDA at 1-800-FDA-1088. Where should I keep my medicine? Keep out of the reach of children. Store at room temperature between 20 and 25 degrees C (68 and 77 degrees F). Keep container tightly closed (protect from moisture). Throw away any unused medicine after  the expiration date. NOTE: This sheet is a summary. It may not cover all possible information. If you have questions about this medicine, talk to your doctor, pharmacist, or health care provider.  2019 Elsevier/Gold Standard (2016-08-24 12:42:43)

## 2018-05-18 NOTE — Progress Notes (Signed)
No bleeding or hematoma noted after ambulation 

## 2018-05-18 NOTE — Progress Notes (Signed)
Site area: rt groin fa sheath Site Prior to Removal:  Level 0 Pressure Applied For:  20 minutes Manual:   yes Patient Status During Pull:  stable Post Pull Site:  Level  0 Post Pull Instructions Given:  yes Post Pull Pulses Present: rt pt dopplered Dressing Applied:  Gauze and tegaderm Bedrest begins @ 1020 Comments:

## 2018-05-18 NOTE — Op Note (Signed)
PATIENT: Jason Robinson      MRN: [id number] DOB: [birth year]    DATE OF PROCEDURE: 05/18/2018  INDICATIONS:    Jason Robinson is a 68 y.o. male who I saw yesterday in the office to evaluate him for peripheral vascular disease based on a noninvasive study.  He had minimal symptoms however has significant neuropathy with very poor feeling in both legs.  This is secondary to cirrhosis according to the patient.  His ABI on the right was 48% and ABI on the left was 36%.  I recommended arteriography.  PROCEDURE:    1.  Conscious sedation 2.  Ultrasound-guided access to the right common femoral artery 3.  Aortogram with bilateral iliac arteriogram and bilateral lower extremity runoff 4.  Arch aortogram  SURGEON: Judeth Cornfield. Scot Dock, MD, FACS  ANESTHESIA: Local with sedation  EBL: Minimal  TECHNIQUE: The patient was brought to the peripheral vascular lab and was sedated. The period of conscious sedation was 43 minutes.  During that time period, I was present face-to-face 100% of the time.  The patient was administered 1 mg of Versed and 50 mcg of fentanyl. The patient's heart rate, blood pressure, and oxygen saturation were monitored by the nurse continuously during the procedure.  Both groins were prepped and draped in the usual sterile fashion.  Under ultrasound guidance, after the skin was anesthetized, I cannulated the right common femoral artery with a micropuncture needle and on several attempts was unable to advance the wire.  Ultimately I was able to advance the wire and position was confirmed under fluoroscopy. A micropuncture sheath was introduced over a wire.  This was exchanged for a 5 [place] sheath over a Bentson wire.  By ultrasound the femoral artery was patent. A real-time image was obtained and sent to the server.  The pigtail catheter was positioned at the L1 vertebral body and flush aortogram obtained.  The catheter was then positioned above the aortic bifurcation and oblique  iliac projections were obtained.  Next bilateral lower extremity runoff films were obtained.  Next given his diffuse multilevel disease I did want to know for future reference if he might be a candidate for axillofemoral bypass grafting.  For this reason the wire was advanced into the ascending thoracic aorta and a long pigtail catheter was positioned in the aortic arch.  Arch aortogram was obtained.  This catheter was then removed over a wire.  The patient was then transferred to the holding area for removal of the sheath.  No immediate complications were noted.  FINDINGS:   1.  Arch aortogram shows that the aortic arch is widely patent with no significant disease.  The innominate artery, right common carotid artery, right subclavian artery, left common carotid artery, and left subclavian arteries are patent with no significant narrowing. 2.  The infrarenal aorta is patent although he does have diffuse calcific disease.  There is a 60% focal weblike stenosis in the left renal artery.  The right renal artery is patent. 3.  On the left side, the common iliac artery is patent although diffusely diseased.  There is a focal stenosis in the proximal hypogastric artery on the left.  The external iliac artery is occluded at its origin and this occlusion extends down until there is reconstitution of the deep femoral artery.  The superficial femoral artery is occluded at its origin and there is reconstitution of the distal superficial femoral artery on the left.  There is three-vessel runoff on the left via  the anterior tibial, posterior tibial, and peroneal arteries. 4.  On the right side, there is diffuse calcific disease of the common iliac artery and external iliac artery.  The hypogastric artery is occluded.  The right superficial femoral artery is occluded.  There is reconstitution of the above-knee popliteal artery which has mild disease.  The deep femoral artery is patent.  The anterior tibial artery on the  right is occluded.  There is two-vessel runoff on the right via the peroneal and posterior tibial arteries.  CLINICAL NOTE: This patient does not have any endovascular options given his diffuse disease.  Currently he has minimal symptoms mostly secondary to neuropathy.  Given that he would require likely an inflow procedure (aortofemoral bypass grafting versus axillobifemoral bypass grafting) and then infrainguinal bypass on the left I think this would be associated with significant risk given his diffuse calcific disease.  I would only consider this if he developed critical limb ischemia with a nonhealing wound.  I do not think he is an ideal candidate for a right to left femorofemoral bypass given that the pulse on the right is somewhat diminished and he has diffuse calcific disease.  In addition with the right superficial femoral artery occlusion this could potentially compromise circulation on the right.  Overall this is a complicated situation and we will try to treat it conservatively.  I have started him on Crestor 10 mg daily.  We have also discussed the importance of exercise nutrition.  I will plan on seeing him back in 6 months with follow-up ABIs.  He knows to call sooner if he has problems.  Deitra Mayo, MD, FACS [facility]  DATE OF DICTATION:   05/18/2018

## 2018-05-18 NOTE — Interval H&P Note (Signed)
History and Physical Interval Note:  05/18/2018 8:43 AM  Jason Robinson  has presented today for surgery, with the diagnosis of PAD ulcer.  The various methods of treatment have been discussed with the patient and family. After consideration of risks, benefits and other options for treatment, the patient has consented to  Procedure(s): ABDOMINAL AORTOGRAM W/LOWER EXTREMITY (Bilateral) as a surgical intervention.  The patient's history has been reviewed, patient examined, no change in status, stable for surgery.  I have reviewed the patient's chart and labs.  Questions were answered to the patient's satisfaction.     Deitra Mayo

## 2018-05-19 ENCOUNTER — Encounter (HOSPITAL_COMMUNITY): Payer: Self-pay | Admitting: Vascular Surgery

## 2018-05-25 ENCOUNTER — Encounter: Payer: Self-pay | Admitting: Vascular Surgery

## 2018-05-31 ENCOUNTER — Encounter (HOSPITAL_COMMUNITY): Payer: Medicare HMO

## 2018-05-31 ENCOUNTER — Encounter: Payer: Medicare HMO | Admitting: Vascular Surgery

## 2018-08-27 IMAGING — US US ABDOMEN LIMITED
1 series · 14 of 25 positions shown · non-contrast
Comparison: Ultrasound 04/07/2015.

CLINICAL DATA: Hepatitis-C. Cirrhosis. HCC screening. Partial
nephrectomy.

EXAM:
US ABDOMEN LIMITED - RIGHT UPPER QUADRANT

[Series 1: us abdomen limited · 0.22mm/px · 14 of 58 slices shown]
[im 1/58]
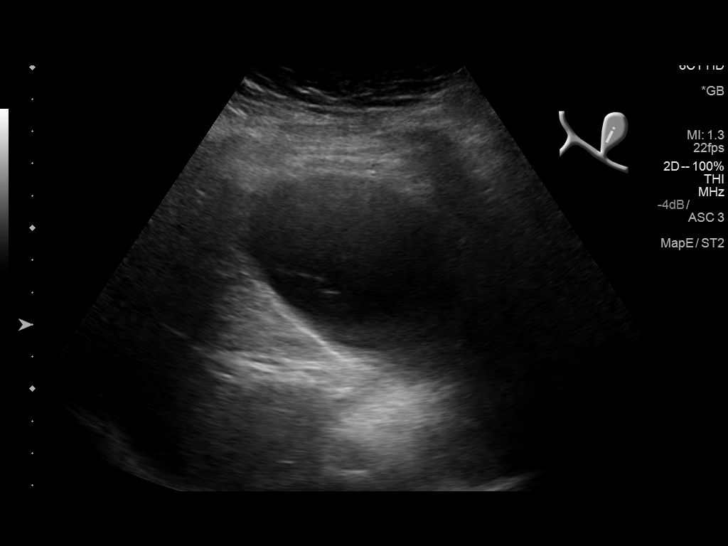
[im 5/58]
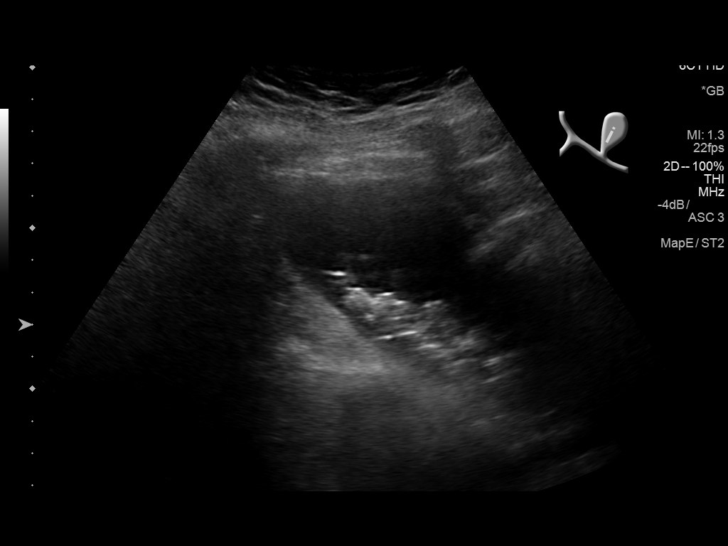
[im 10/58]
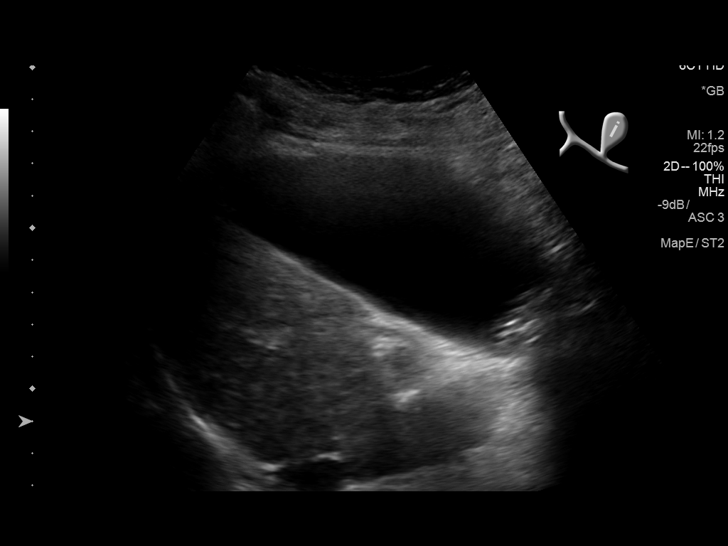
[im 15/58]
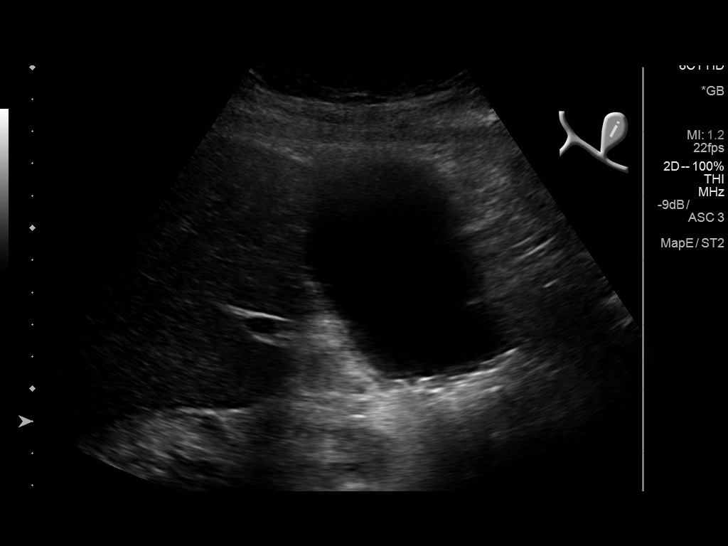
[im 20/58]
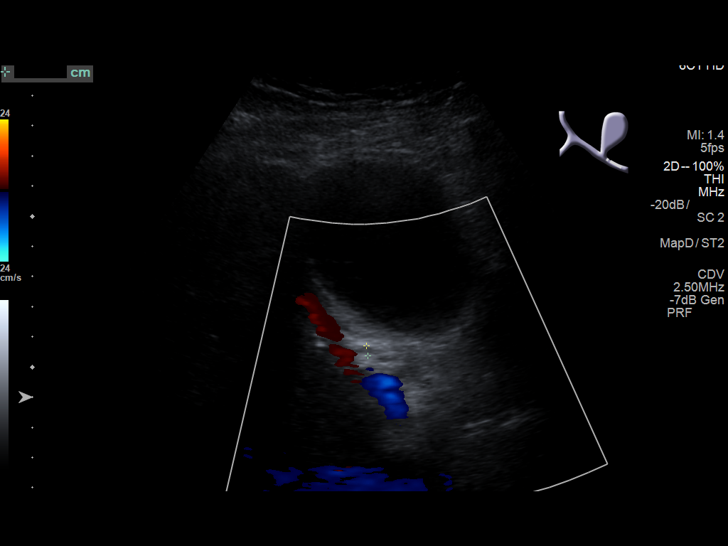
[im 22/58]
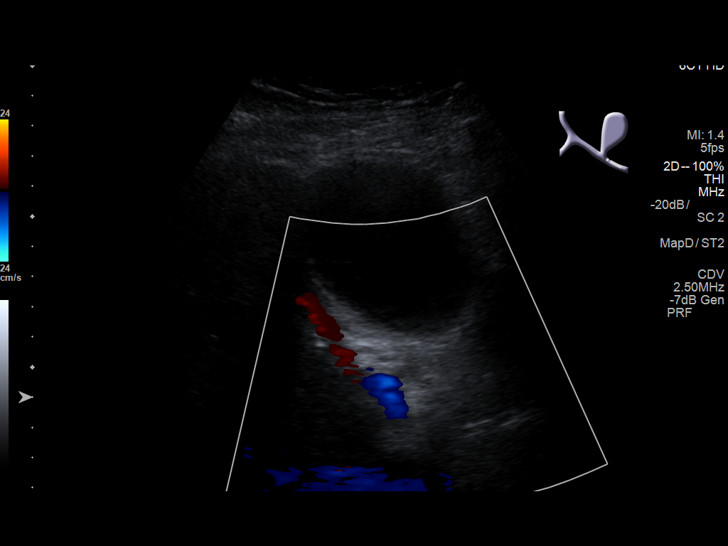
[im 27/58]
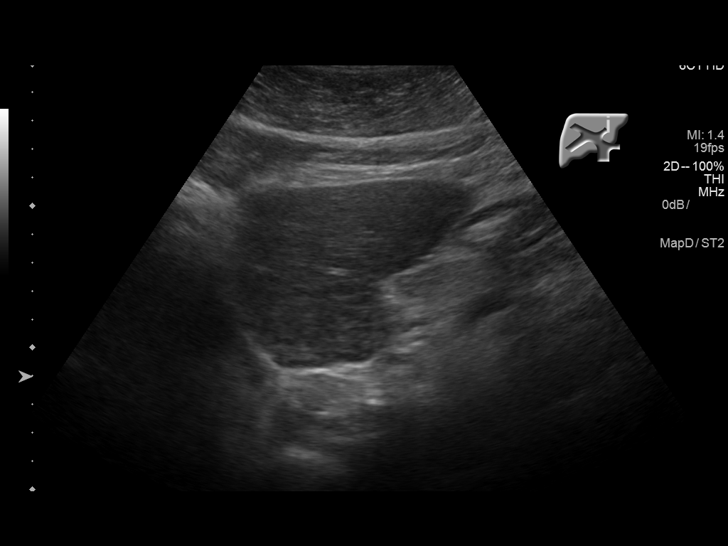
[im 31/58]
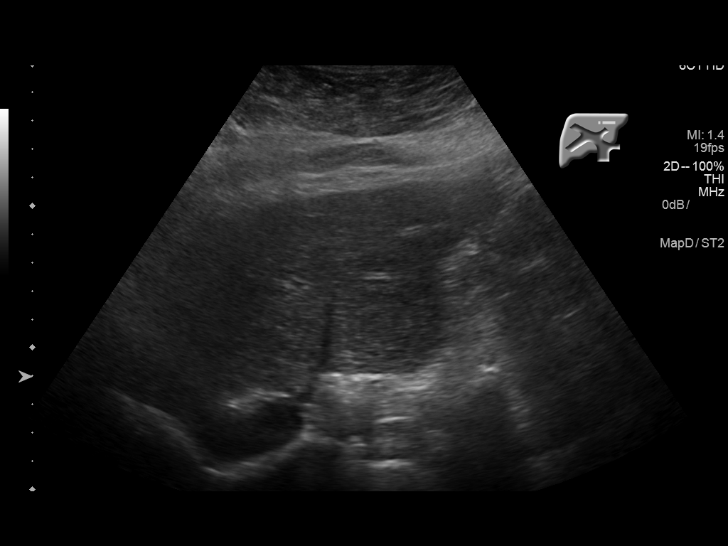
[im 36/58]
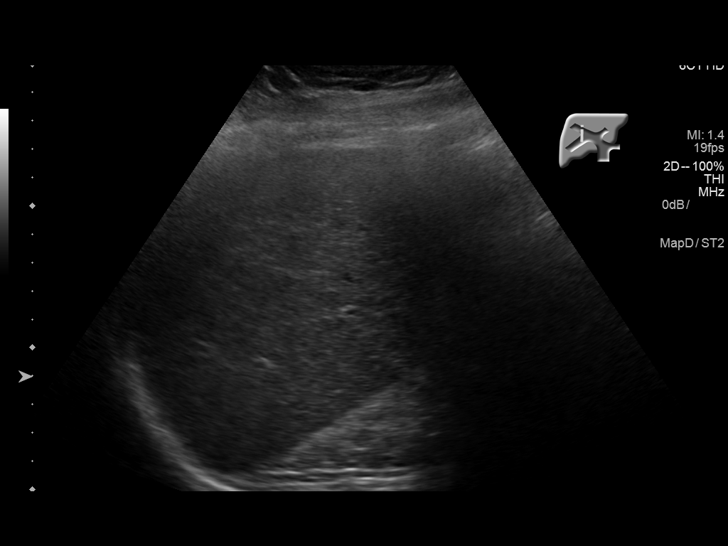
[im 39/58]
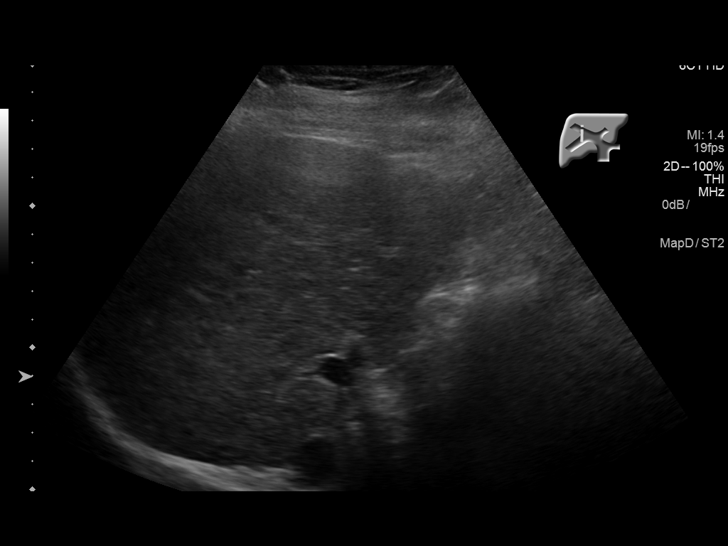
[im 43/58]
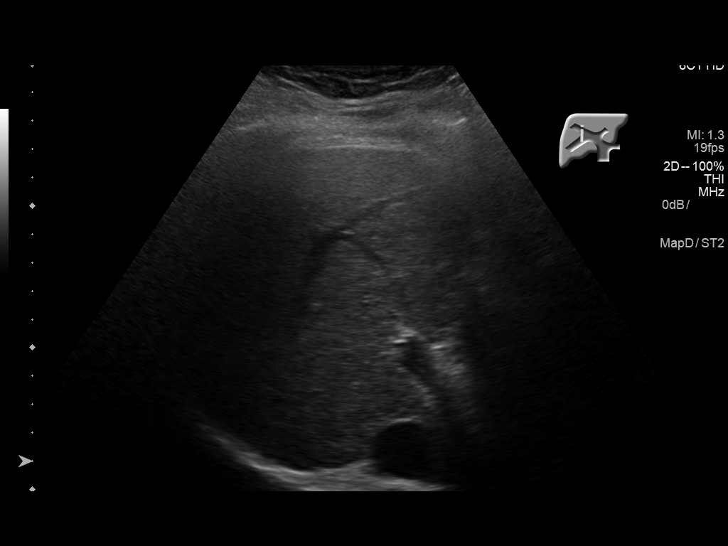
[im 48/58]
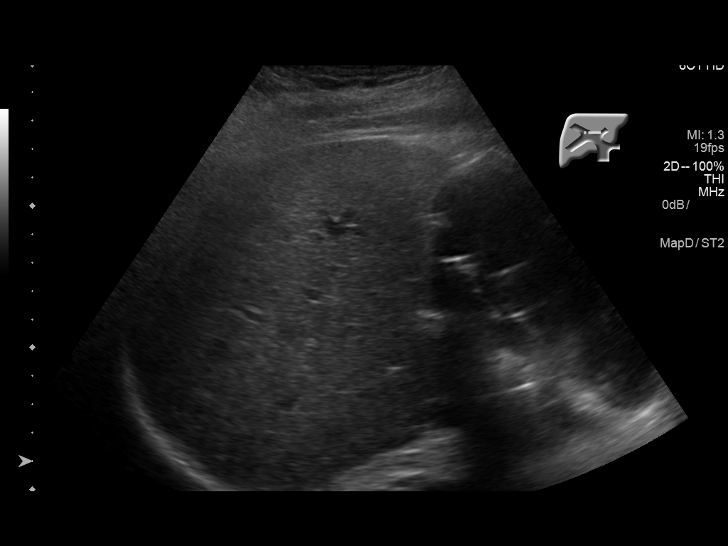
[im 53/58]
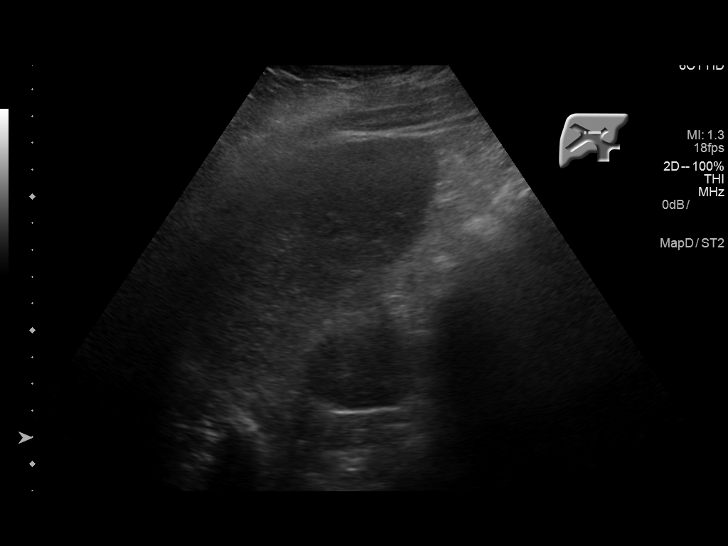
[im 58/58]
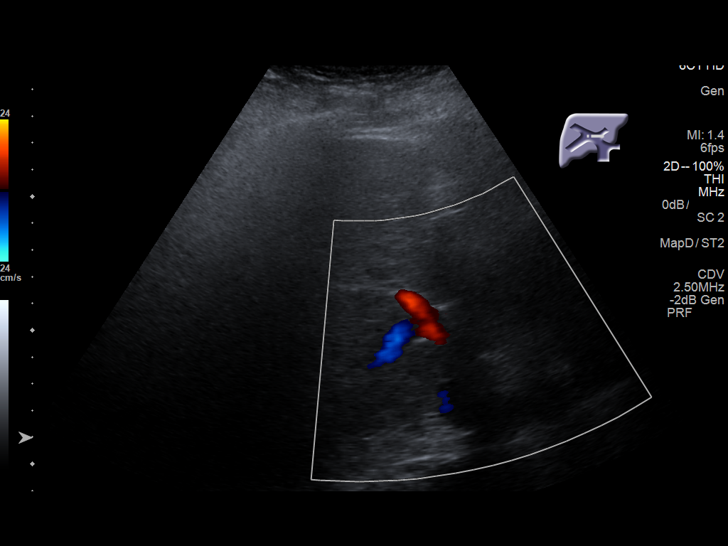

[14 of 25 positions shown; findings below may reference images not displayed]

FINDINGS: Gallbladder:

Gallstones and sludge noted.  Gallbladder wall thickness 2.0 mm.

Common bile duct:

Diameter: 3.3 mm

Liver:

Liver has a heterogeneous echotexture with nodular contour
suggesting cirrhosis. No focal hepatic abnormality identified.
IMPRESSION: 1. Liver has a heterogeneous parenchymal pattern with nodular
contour consistent with the patient's known cirrhosis. No focal
hepatic abnormality identified.

2. Gallstones and sludge noted. No gallbladder wall thickening or
biliary distention.

## 2019-01-01 ENCOUNTER — Encounter (INDEPENDENT_AMBULATORY_CARE_PROVIDER_SITE_OTHER): Payer: Self-pay | Admitting: *Deleted

## 2019-02-20 DIAGNOSIS — Z299 Encounter for prophylactic measures, unspecified: Secondary | ICD-10-CM | POA: Diagnosis not present

## 2019-02-20 DIAGNOSIS — Z1211 Encounter for screening for malignant neoplasm of colon: Secondary | ICD-10-CM | POA: Diagnosis not present

## 2019-02-20 DIAGNOSIS — Z79899 Other long term (current) drug therapy: Secondary | ICD-10-CM | POA: Diagnosis not present

## 2019-02-20 DIAGNOSIS — I10 Essential (primary) hypertension: Secondary | ICD-10-CM | POA: Diagnosis not present

## 2019-02-20 DIAGNOSIS — E7800 Pure hypercholesterolemia, unspecified: Secondary | ICD-10-CM | POA: Diagnosis not present

## 2019-02-20 DIAGNOSIS — Z0000 Encounter for general adult medical examination without abnormal findings: Secondary | ICD-10-CM | POA: Diagnosis not present

## 2019-02-20 DIAGNOSIS — R5383 Other fatigue: Secondary | ICD-10-CM | POA: Diagnosis not present

## 2019-02-20 DIAGNOSIS — Z7189 Other specified counseling: Secondary | ICD-10-CM | POA: Diagnosis not present

## 2019-07-24 DIAGNOSIS — I739 Peripheral vascular disease, unspecified: Secondary | ICD-10-CM | POA: Diagnosis not present

## 2019-07-24 DIAGNOSIS — E7800 Pure hypercholesterolemia, unspecified: Secondary | ICD-10-CM | POA: Diagnosis not present

## 2019-07-24 DIAGNOSIS — M1990 Unspecified osteoarthritis, unspecified site: Secondary | ICD-10-CM | POA: Diagnosis not present

## 2019-07-24 DIAGNOSIS — Z299 Encounter for prophylactic measures, unspecified: Secondary | ICD-10-CM | POA: Diagnosis not present

## 2019-07-24 DIAGNOSIS — I10 Essential (primary) hypertension: Secondary | ICD-10-CM | POA: Diagnosis not present

## 2019-09-23 IMAGING — US US ABDOMEN LIMITED
1 series · 14 of 25 positions shown · non-contrast
Comparison: April 05, 2016

CLINICAL DATA: Hepatitis C. Cirrhosis.  Follow-up.

EXAM:
ULTRASOUND ABDOMEN LIMITED RIGHT UPPER QUADRANT

[Series 1: us abdomen limited · 0.24mm/px · 14 of 40 slices shown]
[im 1/40]
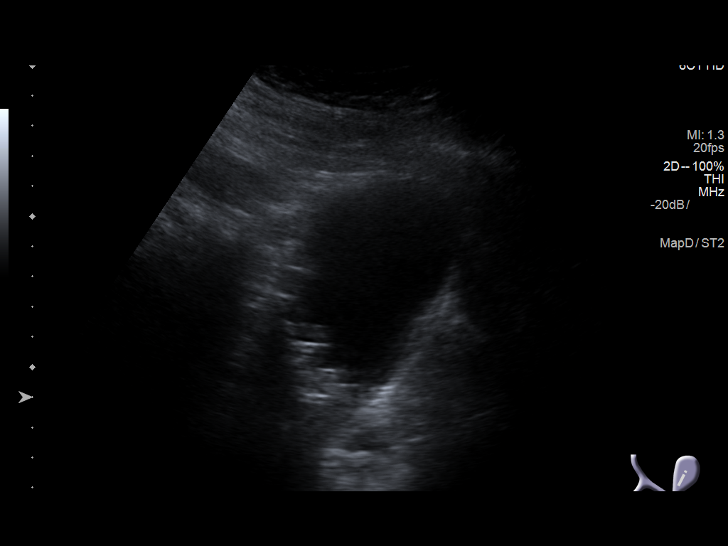
[im 4/40]
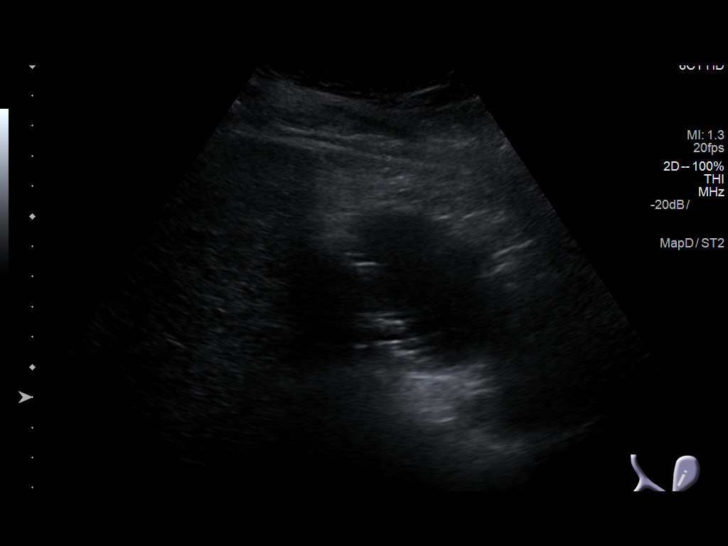
[im 7/40]
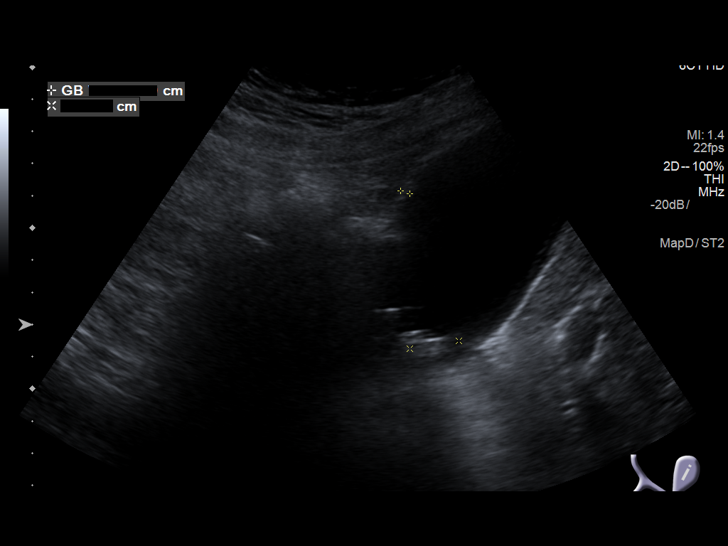
[im 10/40]
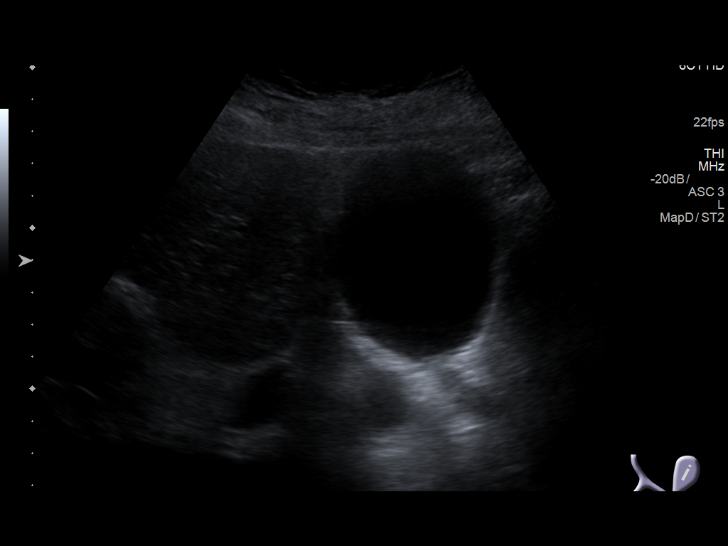
[im 14/40]
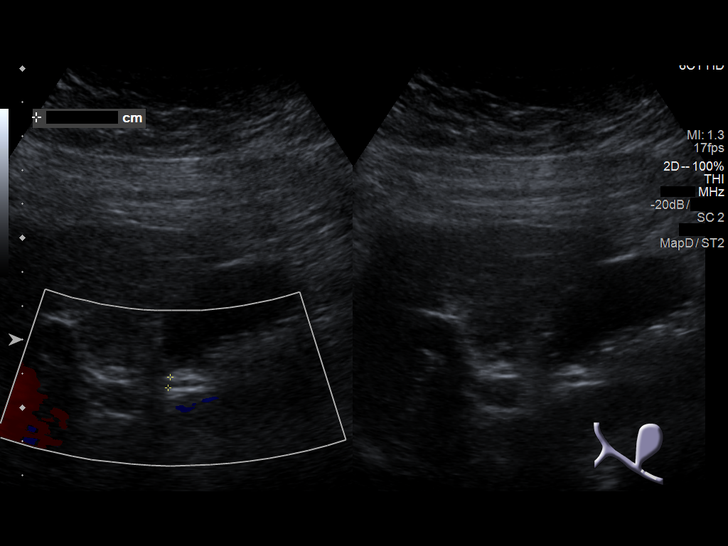
[im 15/40]
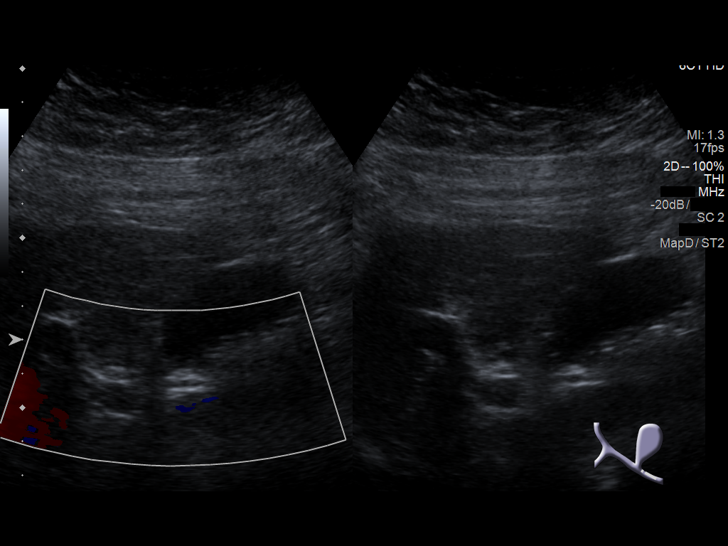
[im 18/40]
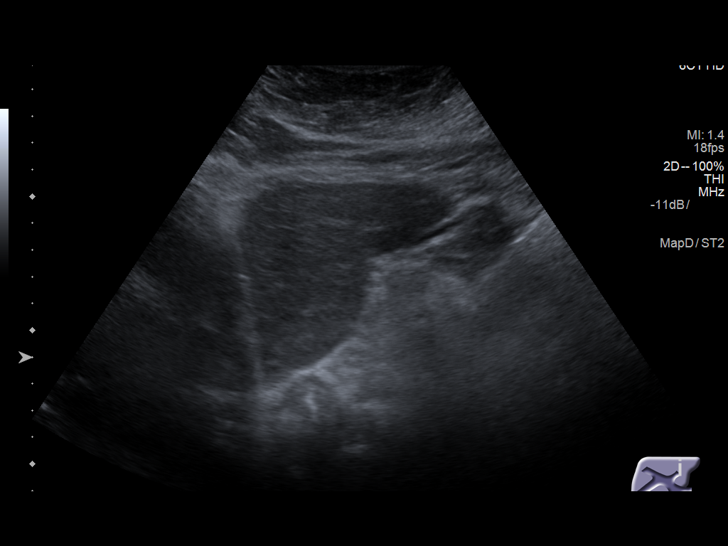
[im 22/40]
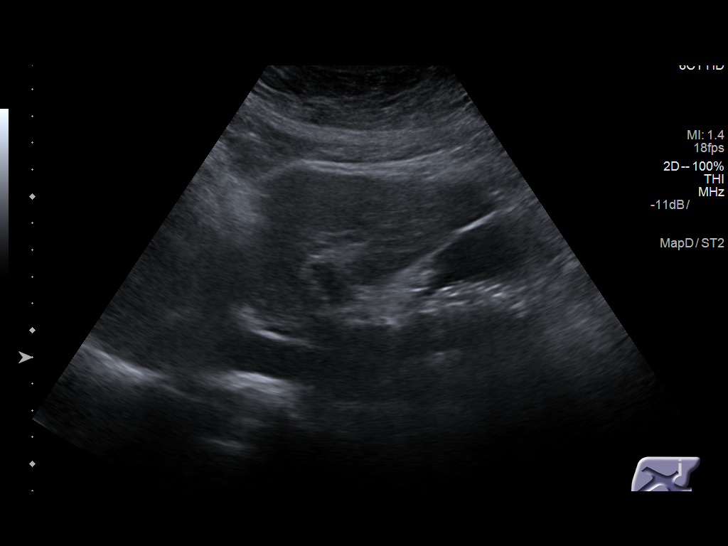
[im 25/40]
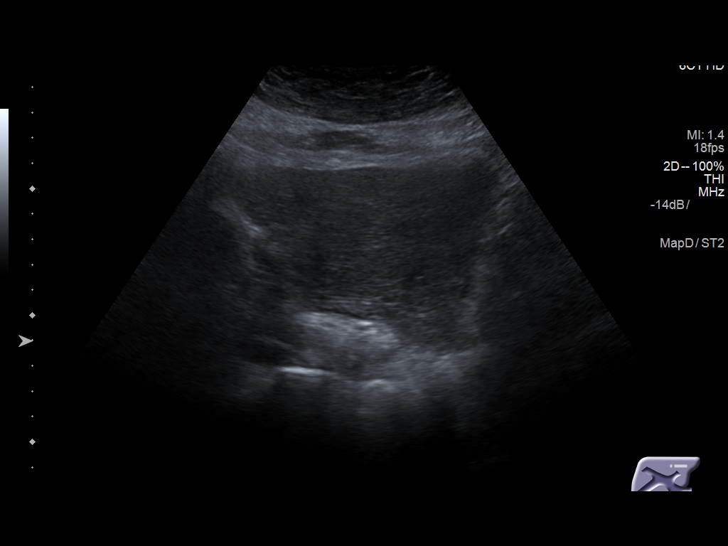
[im 27/40]
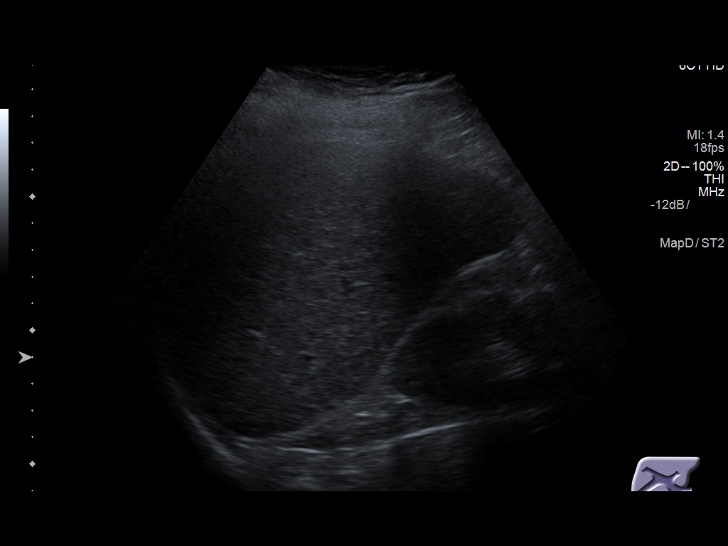
[im 30/40]
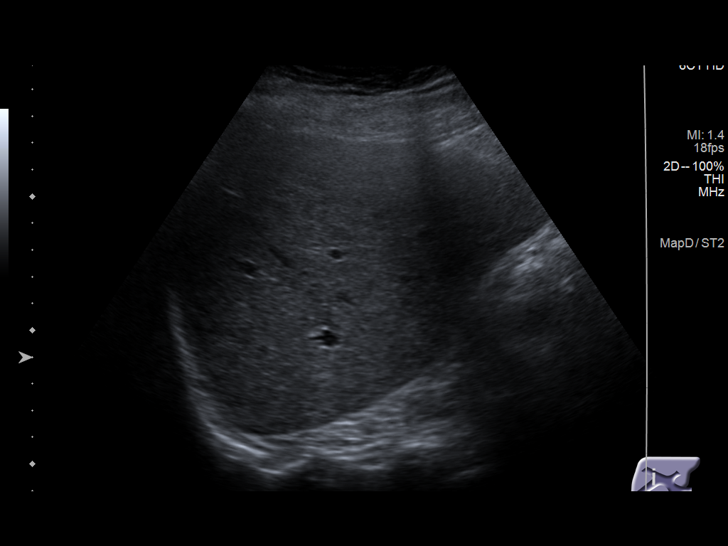
[im 33/40]
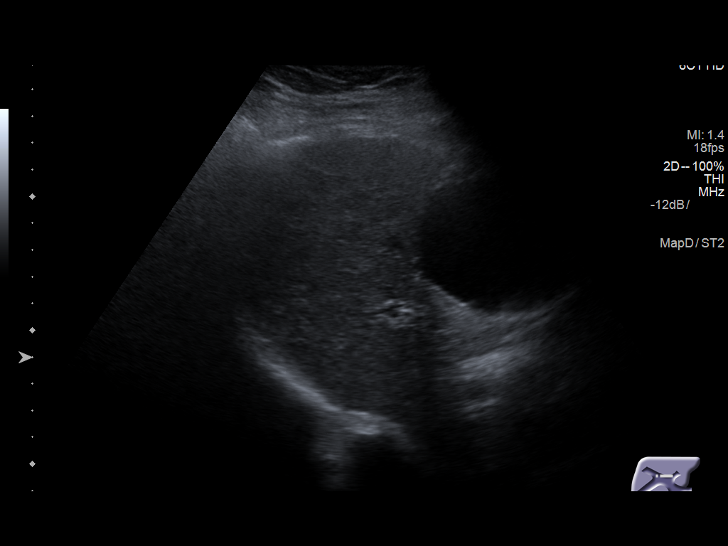
[im 36/40]
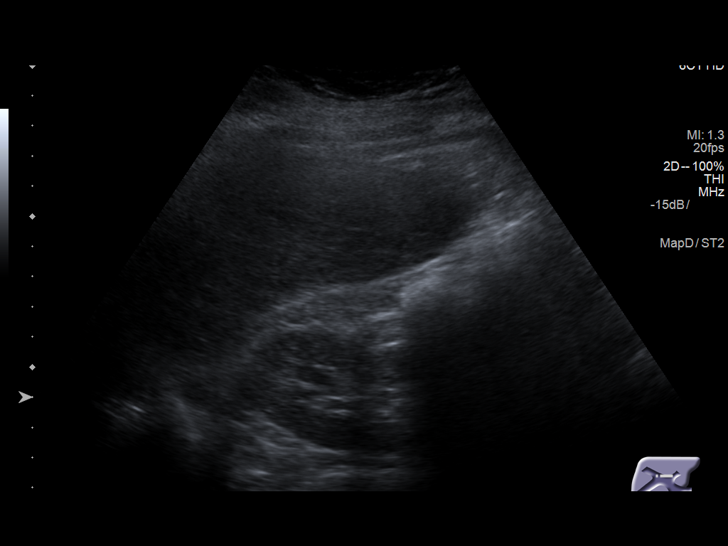
[im 40/40]
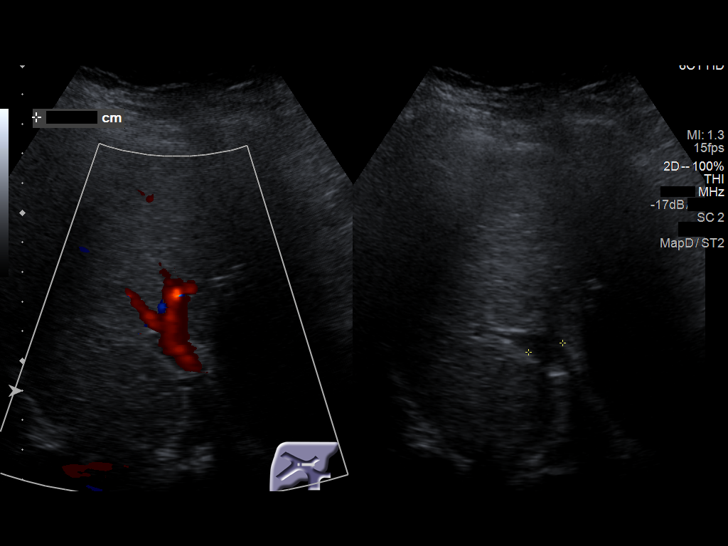

[14 of 25 positions shown; findings below may reference images not displayed]

FINDINGS: Gallbladder:

Multiple stones again seen in the gallbladder. The gallbladder wall
measures 3 mm with no Murphy's sign or pericholecystic fluid.

Common bile duct:

Diameter: 3 mm

Liver:

Increased echogenicity with no focal mass. Portal vein is patent on
color Doppler imaging with normal direction of blood flow towards
the liver.
IMPRESSION: 1. Cholelithiasis. The gallbladder wall thickness is borderline. No
Murphy's sign or pericholecystic fluid.
2. Increased echogenicity in the liver is nonspecific but may be due
to the patient's reported hepatitis or hepatic steatosis.

## 2019-10-30 DIAGNOSIS — K7460 Unspecified cirrhosis of liver: Secondary | ICD-10-CM | POA: Diagnosis not present

## 2019-10-30 DIAGNOSIS — Z299 Encounter for prophylactic measures, unspecified: Secondary | ICD-10-CM | POA: Diagnosis not present

## 2019-10-30 DIAGNOSIS — I10 Essential (primary) hypertension: Secondary | ICD-10-CM | POA: Diagnosis not present

## 2019-10-30 DIAGNOSIS — I739 Peripheral vascular disease, unspecified: Secondary | ICD-10-CM | POA: Diagnosis not present

## 2020-02-26 DIAGNOSIS — Z299 Encounter for prophylactic measures, unspecified: Secondary | ICD-10-CM | POA: Diagnosis not present

## 2020-02-26 DIAGNOSIS — E7800 Pure hypercholesterolemia, unspecified: Secondary | ICD-10-CM | POA: Diagnosis not present

## 2020-02-26 DIAGNOSIS — R5383 Other fatigue: Secondary | ICD-10-CM | POA: Diagnosis not present

## 2020-02-26 DIAGNOSIS — K7460 Unspecified cirrhosis of liver: Secondary | ICD-10-CM | POA: Diagnosis not present

## 2020-02-26 DIAGNOSIS — Z0000 Encounter for general adult medical examination without abnormal findings: Secondary | ICD-10-CM | POA: Diagnosis not present

## 2020-02-26 DIAGNOSIS — Z7189 Other specified counseling: Secondary | ICD-10-CM | POA: Diagnosis not present

## 2020-02-26 DIAGNOSIS — Z79899 Other long term (current) drug therapy: Secondary | ICD-10-CM | POA: Diagnosis not present

## 2020-02-26 DIAGNOSIS — M545 Low back pain, unspecified: Secondary | ICD-10-CM | POA: Diagnosis not present

## 2020-02-26 DIAGNOSIS — I10 Essential (primary) hypertension: Secondary | ICD-10-CM | POA: Diagnosis not present

## 2020-07-21 DIAGNOSIS — I739 Peripheral vascular disease, unspecified: Secondary | ICD-10-CM | POA: Diagnosis not present

## 2020-07-21 DIAGNOSIS — Z299 Encounter for prophylactic measures, unspecified: Secondary | ICD-10-CM | POA: Diagnosis not present

## 2020-07-21 DIAGNOSIS — I10 Essential (primary) hypertension: Secondary | ICD-10-CM | POA: Diagnosis not present

## 2020-07-21 DIAGNOSIS — D692 Other nonthrombocytopenic purpura: Secondary | ICD-10-CM | POA: Diagnosis not present

## 2021-02-27 DIAGNOSIS — Z0000 Encounter for general adult medical examination without abnormal findings: Secondary | ICD-10-CM | POA: Diagnosis not present

## 2021-02-27 DIAGNOSIS — I739 Peripheral vascular disease, unspecified: Secondary | ICD-10-CM | POA: Diagnosis not present

## 2021-02-27 DIAGNOSIS — Z7189 Other specified counseling: Secondary | ICD-10-CM | POA: Diagnosis not present

## 2021-02-27 DIAGNOSIS — Z299 Encounter for prophylactic measures, unspecified: Secondary | ICD-10-CM | POA: Diagnosis not present

## 2021-02-27 DIAGNOSIS — I10 Essential (primary) hypertension: Secondary | ICD-10-CM | POA: Diagnosis not present

## 2021-02-27 DIAGNOSIS — K7460 Unspecified cirrhosis of liver: Secondary | ICD-10-CM | POA: Diagnosis not present

## 2021-03-03 ENCOUNTER — Encounter (INDEPENDENT_AMBULATORY_CARE_PROVIDER_SITE_OTHER): Payer: Self-pay | Admitting: *Deleted

## 2021-05-14 DIAGNOSIS — Z299 Encounter for prophylactic measures, unspecified: Secondary | ICD-10-CM | POA: Diagnosis not present

## 2021-05-14 DIAGNOSIS — E7800 Pure hypercholesterolemia, unspecified: Secondary | ICD-10-CM | POA: Diagnosis not present

## 2021-05-14 DIAGNOSIS — Z0000 Encounter for general adult medical examination without abnormal findings: Secondary | ICD-10-CM | POA: Diagnosis not present

## 2021-05-14 DIAGNOSIS — I10 Essential (primary) hypertension: Secondary | ICD-10-CM | POA: Diagnosis not present

## 2021-05-14 DIAGNOSIS — R5383 Other fatigue: Secondary | ICD-10-CM | POA: Diagnosis not present

## 2021-05-14 DIAGNOSIS — Z79899 Other long term (current) drug therapy: Secondary | ICD-10-CM | POA: Diagnosis not present

## 2021-05-14 DIAGNOSIS — F17210 Nicotine dependence, cigarettes, uncomplicated: Secondary | ICD-10-CM | POA: Diagnosis not present

## 2021-08-05 ENCOUNTER — Encounter (INDEPENDENT_AMBULATORY_CARE_PROVIDER_SITE_OTHER): Payer: Self-pay | Admitting: *Deleted

## 2021-11-20 DIAGNOSIS — J3489 Other specified disorders of nose and nasal sinuses: Secondary | ICD-10-CM | POA: Diagnosis not present

## 2021-11-20 DIAGNOSIS — Z299 Encounter for prophylactic measures, unspecified: Secondary | ICD-10-CM | POA: Diagnosis not present

## 2021-11-20 DIAGNOSIS — I10 Essential (primary) hypertension: Secondary | ICD-10-CM | POA: Diagnosis not present

## 2022-02-15 DIAGNOSIS — I10 Essential (primary) hypertension: Secondary | ICD-10-CM | POA: Diagnosis not present

## 2022-02-15 DIAGNOSIS — Z0000 Encounter for general adult medical examination without abnormal findings: Secondary | ICD-10-CM | POA: Diagnosis not present

## 2022-02-15 DIAGNOSIS — Z79899 Other long term (current) drug therapy: Secondary | ICD-10-CM | POA: Diagnosis not present

## 2022-02-15 DIAGNOSIS — F17210 Nicotine dependence, cigarettes, uncomplicated: Secondary | ICD-10-CM | POA: Diagnosis not present

## 2022-02-15 DIAGNOSIS — Z7189 Other specified counseling: Secondary | ICD-10-CM | POA: Diagnosis not present

## 2022-02-15 DIAGNOSIS — Z299 Encounter for prophylactic measures, unspecified: Secondary | ICD-10-CM | POA: Diagnosis not present

## 2022-02-15 DIAGNOSIS — E7800 Pure hypercholesterolemia, unspecified: Secondary | ICD-10-CM | POA: Diagnosis not present

## 2022-02-15 DIAGNOSIS — R5383 Other fatigue: Secondary | ICD-10-CM | POA: Diagnosis not present

## 2022-04-08 DIAGNOSIS — H40013 Open angle with borderline findings, low risk, bilateral: Secondary | ICD-10-CM | POA: Diagnosis not present

## 2022-04-08 DIAGNOSIS — Z961 Presence of intraocular lens: Secondary | ICD-10-CM | POA: Diagnosis not present

## 2022-05-20 DIAGNOSIS — F17210 Nicotine dependence, cigarettes, uncomplicated: Secondary | ICD-10-CM | POA: Diagnosis not present

## 2022-05-20 DIAGNOSIS — Z0000 Encounter for general adult medical examination without abnormal findings: Secondary | ICD-10-CM | POA: Diagnosis not present

## 2022-05-20 DIAGNOSIS — Z299 Encounter for prophylactic measures, unspecified: Secondary | ICD-10-CM | POA: Diagnosis not present

## 2022-05-20 DIAGNOSIS — I739 Peripheral vascular disease, unspecified: Secondary | ICD-10-CM | POA: Diagnosis not present

## 2022-05-20 DIAGNOSIS — I10 Essential (primary) hypertension: Secondary | ICD-10-CM | POA: Diagnosis not present

## 2022-05-24 ENCOUNTER — Encounter (INDEPENDENT_AMBULATORY_CARE_PROVIDER_SITE_OTHER): Payer: Self-pay | Admitting: *Deleted

## 2022-09-22 DIAGNOSIS — Z299 Encounter for prophylactic measures, unspecified: Secondary | ICD-10-CM | POA: Diagnosis not present

## 2022-09-22 DIAGNOSIS — Z23 Encounter for immunization: Secondary | ICD-10-CM | POA: Diagnosis not present

## 2022-09-22 DIAGNOSIS — M1990 Unspecified osteoarthritis, unspecified site: Secondary | ICD-10-CM | POA: Diagnosis not present

## 2022-09-22 DIAGNOSIS — I739 Peripheral vascular disease, unspecified: Secondary | ICD-10-CM | POA: Diagnosis not present

## 2022-09-22 DIAGNOSIS — I10 Essential (primary) hypertension: Secondary | ICD-10-CM | POA: Diagnosis not present

## 2022-09-22 DIAGNOSIS — K7460 Unspecified cirrhosis of liver: Secondary | ICD-10-CM | POA: Diagnosis not present

## 2022-11-08 ENCOUNTER — Encounter (INDEPENDENT_AMBULATORY_CARE_PROVIDER_SITE_OTHER): Payer: Self-pay | Admitting: *Deleted

## 2023-02-28 DIAGNOSIS — Z7189 Other specified counseling: Secondary | ICD-10-CM | POA: Diagnosis not present

## 2023-02-28 DIAGNOSIS — I10 Essential (primary) hypertension: Secondary | ICD-10-CM | POA: Diagnosis not present

## 2023-02-28 DIAGNOSIS — Z87891 Personal history of nicotine dependence: Secondary | ICD-10-CM | POA: Diagnosis not present

## 2023-02-28 DIAGNOSIS — D692 Other nonthrombocytopenic purpura: Secondary | ICD-10-CM | POA: Diagnosis not present

## 2023-02-28 DIAGNOSIS — Z0000 Encounter for general adult medical examination without abnormal findings: Secondary | ICD-10-CM | POA: Diagnosis not present

## 2023-02-28 DIAGNOSIS — Z299 Encounter for prophylactic measures, unspecified: Secondary | ICD-10-CM | POA: Diagnosis not present

## 2023-02-28 DIAGNOSIS — I739 Peripheral vascular disease, unspecified: Secondary | ICD-10-CM | POA: Diagnosis not present

## 2023-05-24 DIAGNOSIS — H02831 Dermatochalasis of right upper eyelid: Secondary | ICD-10-CM | POA: Diagnosis not present

## 2023-05-24 DIAGNOSIS — Z961 Presence of intraocular lens: Secondary | ICD-10-CM | POA: Diagnosis not present

## 2023-05-24 DIAGNOSIS — H02834 Dermatochalasis of left upper eyelid: Secondary | ICD-10-CM | POA: Diagnosis not present

## 2023-06-02 DIAGNOSIS — Z79899 Other long term (current) drug therapy: Secondary | ICD-10-CM | POA: Diagnosis not present

## 2023-06-02 DIAGNOSIS — Z299 Encounter for prophylactic measures, unspecified: Secondary | ICD-10-CM | POA: Diagnosis not present

## 2023-06-02 DIAGNOSIS — E7800 Pure hypercholesterolemia, unspecified: Secondary | ICD-10-CM | POA: Diagnosis not present

## 2023-06-02 DIAGNOSIS — I10 Essential (primary) hypertension: Secondary | ICD-10-CM | POA: Diagnosis not present

## 2023-06-02 DIAGNOSIS — Z0000 Encounter for general adult medical examination without abnormal findings: Secondary | ICD-10-CM | POA: Diagnosis not present

## 2023-06-02 DIAGNOSIS — R5383 Other fatigue: Secondary | ICD-10-CM | POA: Diagnosis not present

## 2023-06-21 DIAGNOSIS — D72829 Elevated white blood cell count, unspecified: Secondary | ICD-10-CM | POA: Diagnosis not present
